# Patient Record
Sex: Female | Born: 1985 | Race: White | Hispanic: No | Marital: Married | Smoking: Never smoker
Health system: Southern US, Community
[De-identification: ages and names within clinical notes are randomized; demographics above are authoritative.]

## PROBLEM LIST (undated history)

## (undated) DIAGNOSIS — O99345 Other mental disorders complicating the puerperium: Secondary | ICD-10-CM

## (undated) DIAGNOSIS — Z1509 Genetic susceptibility to other malignant neoplasm: Secondary | ICD-10-CM

## (undated) DIAGNOSIS — K219 Gastro-esophageal reflux disease without esophagitis: Secondary | ICD-10-CM

## (undated) DIAGNOSIS — N879 Dysplasia of cervix uteri, unspecified: Secondary | ICD-10-CM

## (undated) DIAGNOSIS — R7303 Prediabetes: Secondary | ICD-10-CM

## (undated) DIAGNOSIS — F53 Postpartum depression: Secondary | ICD-10-CM

## (undated) HISTORY — DX: Dysplasia of cervix uteri, unspecified: N87.9

## (undated) HISTORY — PX: ESOPHAGOGASTRODUODENOSCOPY ENDOSCOPY: SHX5814

## (undated) HISTORY — PX: COLONOSCOPY: SHX174

---

## 2010-06-24 DIAGNOSIS — N879 Dysplasia of cervix uteri, unspecified: Secondary | ICD-10-CM

## 2010-06-24 HISTORY — DX: Dysplasia of cervix uteri, unspecified: N87.9

## 2014-06-24 NOTE — L&D Delivery Note (Signed)
Delivery Note At 8:04 PM a viable female with good cry was delivered via Vaginal, Spontaneous Delivery (Presentation: Right Occiput Anterior) with nuchal cord x 1 reduced over shoulder during delivery. Meconium stained amniotic fluid noted with delivery of fetal head. APGAR: 8, 9; weight pending .   Placenta status: Intact, Spontaneous.  Cord: 3 vessels with the following complications: None.    Anesthesia: Epidural  Episiotomy: None Lacerations: Partial 3rd degree Suture Repair: 2.0 3.0 chromic for repair of second degree and 2-0 Vicryl for repair of 3rd degree laceration. RV septum intact Est. Blood Loss (mL): 450  Mom to recovering in LDR.  Baby to Couplet care / Skin to Skin.  Jessica Cardenas 06/05/2015, 9:30 PM

## 2014-07-19 ENCOUNTER — Emergency Department: Payer: Self-pay | Admitting: Emergency Medicine

## 2014-07-19 LAB — CBC
HCT: 38 % (ref 35.0–47.0)
HGB: 12.8 g/dL (ref 12.0–16.0)
MCH: 28.3 pg (ref 26.0–34.0)
MCHC: 33.7 g/dL (ref 32.0–36.0)
MCV: 84 fL (ref 80–100)
PLATELETS: 252 10*3/uL (ref 150–440)
RBC: 4.53 10*6/uL (ref 3.80–5.20)
RDW: 13 % (ref 11.5–14.5)
WBC: 9.8 10*3/uL (ref 3.6–11.0)

## 2014-07-19 LAB — HCG, QUANTITATIVE, PREGNANCY: Beta Hcg, Quant.: 6 m[IU]/mL — ABNORMAL HIGH

## 2014-07-19 LAB — WET PREP, GENITAL

## 2014-07-20 LAB — GC/CHLAMYDIA PROBE AMP

## 2014-09-20 LAB — OB RESULTS CONSOLE ABO/RH: RH Type: POSITIVE

## 2014-09-20 LAB — OB RESULTS CONSOLE PLATELET COUNT: Platelets: 253 10*3/uL

## 2014-09-20 LAB — OB RESULTS CONSOLE HGB/HCT, BLOOD
HCT: 36 %
HEMOGLOBIN: 12 g/dL

## 2014-09-20 LAB — OB RESULTS CONSOLE VARICELLA ZOSTER ANTIBODY, IGG: Varicella: IMMUNE

## 2014-09-20 LAB — OB RESULTS CONSOLE GC/CHLAMYDIA
CHLAMYDIA, DNA PROBE: NEGATIVE
Gonorrhea: NEGATIVE

## 2014-09-20 LAB — OB RESULTS CONSOLE HIV ANTIBODY (ROUTINE TESTING): HIV: NONREACTIVE

## 2014-09-20 LAB — OB RESULTS CONSOLE HEPATITIS B SURFACE ANTIGEN: HEP B S AG: NEGATIVE

## 2014-09-20 LAB — OB RESULTS CONSOLE ANTIBODY SCREEN: ANTIBODY SCREEN: NEGATIVE

## 2014-09-20 LAB — OB RESULTS CONSOLE RPR: RPR: NONREACTIVE

## 2014-09-20 LAB — OB RESULTS CONSOLE RUBELLA ANTIBODY, IGM: Rubella: IMMUNE

## 2015-05-04 LAB — OB RESULTS CONSOLE GC/CHLAMYDIA
Chlamydia: NEGATIVE
Gonorrhea: NEGATIVE

## 2015-05-04 LAB — OB RESULTS CONSOLE GBS: GBS: NEGATIVE

## 2015-06-04 ENCOUNTER — Inpatient Hospital Stay
Admission: EM | Admit: 2015-06-04 | Discharge: 2015-06-06 | DRG: 775 | Disposition: A | Discharge status: Home / Self Care | Payer: BC Managed Care – PPO | Attending: Obstetrics and Gynecology | Admitting: Obstetrics and Gynecology

## 2015-06-04 ENCOUNTER — Encounter: Payer: Self-pay | Admitting: *Deleted

## 2015-06-04 DIAGNOSIS — Z3A40 40 weeks gestation of pregnancy: Secondary | ICD-10-CM

## 2015-06-04 DIAGNOSIS — O99214 Obesity complicating childbirth: Secondary | ICD-10-CM | POA: Diagnosis present

## 2015-06-04 DIAGNOSIS — Z809 Family history of malignant neoplasm, unspecified: Secondary | ICD-10-CM

## 2015-06-04 DIAGNOSIS — Z79899 Other long term (current) drug therapy: Secondary | ICD-10-CM

## 2015-06-04 DIAGNOSIS — O480 Post-term pregnancy: Secondary | ICD-10-CM | POA: Diagnosis present

## 2015-06-04 DIAGNOSIS — Z6841 Body Mass Index (BMI) 40.0 and over, adult: Secondary | ICD-10-CM

## 2015-06-04 DIAGNOSIS — Z1509 Genetic susceptibility to other malignant neoplasm: Secondary | ICD-10-CM

## 2015-06-04 HISTORY — DX: Genetic susceptibility to other malignant neoplasm: Z15.09

## 2015-06-04 MED ORDER — TERBUTALINE SULFATE 1 MG/ML IJ SOLN: 0.2500 mg | Freq: Once | INTRAMUSCULAR | Status: DC | PRN

## 2015-06-04 MED ORDER — SODIUM CHLORIDE 0.9 % IV SOLN: 250.0000 mL | INTRAVENOUS | Status: DC | PRN

## 2015-06-04 MED ORDER — OXYTOCIN BOLUS FROM INFUSION: 500.0000 mL | INTRAVENOUS | Status: DC

## 2015-06-04 MED ORDER — SODIUM CHLORIDE 0.9 % IJ SOLN: 3.0000 mL | INTRAMUSCULAR | Status: DC | PRN

## 2015-06-04 MED ORDER — SODIUM CHLORIDE 0.9 % IJ SOLN: 3.0000 mL | Freq: Two times a day (BID) | INTRAMUSCULAR | Status: DC

## 2015-06-04 MED ORDER — FAMOTIDINE 20 MG PO TABS: 20.0000 mg | ORAL_TABLET | Freq: Two times a day (BID) | ORAL | Status: DC | PRN

## 2015-06-04 MED ORDER — CITRIC ACID-SODIUM CITRATE 334-500 MG/5ML PO SOLN: 30.0000 mL | ORAL | Status: DC | PRN

## 2015-06-04 MED ORDER — OXYTOCIN 40 UNITS IN LACTATED RINGERS INFUSION - SIMPLE MED
62.5000 mL/h | INTRAVENOUS | Status: DC
  Filled 2015-06-04: qty 1000

## 2015-06-04 MED ORDER — LACTATED RINGERS IV SOLN
500.0000 mL | INTRAVENOUS | Status: DC | PRN
  Administered 2015-06-05 (×2): 500 mL via INTRAVENOUS

## 2015-06-04 MED ORDER — LACTATED RINGERS IV SOLN
INTRAVENOUS | Status: DC
  Administered 2015-06-04: 125 mL/h via INTRAVENOUS
  Administered 2015-06-05: 11:00:00 via INTRAVENOUS

## 2015-06-04 MED ORDER — ACETAMINOPHEN 325 MG PO TABS: 650.0000 mg | ORAL_TABLET | ORAL | Status: DC | PRN

## 2015-06-04 MED ORDER — ONDANSETRON HCL 4 MG/2ML IJ SOLN: 4.0000 mg | Freq: Four times a day (QID) | INTRAMUSCULAR | Status: DC | PRN

## 2015-06-04 MED ORDER — FENTANYL CITRATE (PF) 100 MCG/2ML IJ SOLN: 50.0000 ug | INTRAMUSCULAR | Status: DC | PRN

## 2015-06-04 MED ORDER — DINOPROSTONE 10 MG VA INST
10.0000 mg | VAGINAL_INSERT | Freq: Once | VAGINAL | Status: AC
Start: 2015-06-04 — End: 2015-06-04
  Administered 2015-06-04: 10 mg via VAGINAL
  Filled 2015-06-04: qty 1

## 2015-06-04 MED ORDER — LIDOCAINE HCL (PF) 1 % IJ SOLN
30.0000 mL | INTRAMUSCULAR | Status: DC | PRN
  Administered 2015-06-05: 5 mL via SUBCUTANEOUS
  Filled 2015-06-04: qty 30

## 2015-06-04 NOTE — H&P (Signed)
Obstetrics Admission History & Physical  06/04/2015 - 10:18 PM Primary OBGYN: [facility]  Chief Complaint: PDIOL  History of Present Illness  29 y.o. G2P0010 @ 40/6, with the above CC. Pregnancy complicated by: BMI 40, lynch carrier.  [name] states that she's had no s/s of labor or decreased FM  Review of Systems:  her 12 point review of systems is negative or as noted in the History of Present Illness.   PMHx:  Past Medical History  Diagnosis Date  . Vaginal Pap smear, abnormal     approx 5 years ago due to HPV, resolved  . Lynch syndrome    PSHx:  Past Surgical History  Procedure Laterality Date  . Colonoscopy N/A 01/2014    HX of Lynch Syndrome (genetic)   Medications:  Prescriptions prior to admission  Medication Sig Dispense Refill Last Dose  . Prenatal Vit-Fe Fumarate-FA (PRENATAL MULTIVITAMIN) TABS tablet Take 1 tablet by mouth daily at 12 noon.   06/03/2015 at 2030  . ranitidine (ZANTAC) 150 MG tablet Take 150 mg by mouth once.   06/03/2015 at 2030     Allergies: is allergic to aloe vera and penicillins. OBHx: SAB x 1 GYNHx:  History of abnormal pap smears: Yes.   History of STIs: No..             FHx:  Family History  Problem Relation Age of Onset  . Cancer - Other Maternal Grandmother   . Cardiomyopathy Maternal Grandmother   . Cancer - Other Paternal Grandmother   . Cancer - Other Paternal Grandfather    Soc Hx:  Social History   Social History  . Marital Status: Married    Spouse Name: N/A  . Number of Children: N/A  . Years of Education: N/A   Occupational History  . Not on file.   Social History Main Topics  . Smoking status: Never Smoker   . Smokeless tobacco: Not on file  . Alcohol Use: No  . Drug Use: No  . Sexual Activity: Yes   Other Topics Concern  . Not on file   Social History Narrative  . No narrative on file    Objective    Current Vital Signs 24h Vital Sign Ranges  T 98.1 F (36.7 C) Temp  Avg: 98.1 F  (36.7 C)  Min: 98.1 F (36.7 C)  Max: 98.1 F (36.7 C)  BP 122/81 mmHg BP  Min: 122/81  Max: 122/81  HR 88 Pulse  Avg: 88  Min: 88  Max: 88  RR 18 Resp  Avg: 18  Min: 18  Max: 18  SaO2    98/RA No Data Recorded       24 Hour I/O Current Shift I/O  Time Ins Outs       EFM: 125 baseline, +accels, no decels, mod var  Toco: quiet  General: Well nourished, well developed female in no acute distress.  Skin:  Warm and dry.  Cardiovascular: Regular rate and rhythm. Respiratory:  Clear to auscultation bilateral. Normal respiratory effort Abdomen: obese, nttp Neuro/Psych:  Normal mood and affect.   SSE: deferred SVE: FT per RN; was 0/70/-3 on 12/6 per Dr Jean RosenthalJackson Leopolds/EFW: 3700gm  Labs  pending  Radiology BSUS cephalic  Perinatal info  A pos/ Rubella  Immune / Varicella Immune/RPR pending/HIV Negative/HepB Surf Ag Negative/TDaP:yes / Flu shot: yes/pap neg 2015/  Assessment & Plan   29 y.o. G2P0010 @ 40/6 here for PDIOL; pt currently stable *IUP: category I with accels *  PDIOL: process d/w pt and husband. Recommend ripening with cervidil, which they are amenable to. Birth plan reviewed *BMI 40: keep close eye on labor curve, given untested pelvis. +40-45lbs weight gain this pregnancy. S/p normal 28wk 1hr GCT *GBS: neg *Analgesia: no current needs  Cornelia Copa. MD [facility] Pager [phone]

## 2015-06-05 ENCOUNTER — Encounter: Payer: Self-pay | Admitting: *Deleted

## 2015-06-05 ENCOUNTER — Inpatient Hospital Stay: Payer: BC Managed Care – PPO | Admitting: Anesthesiology

## 2015-06-05 LAB — CBC
HCT: 35.7 % (ref 35.0–47.0)
Hemoglobin: 12 g/dL (ref 12.0–16.0)
MCH: 28.7 pg (ref 26.0–34.0)
MCHC: 33.7 g/dL (ref 32.0–36.0)
MCV: 85.2 fL (ref 80.0–100.0)
PLATELETS: 220 10*3/uL (ref 150–440)
RBC: 4.19 MIL/uL (ref 3.80–5.20)
RDW: 13.4 % (ref 11.5–14.5)
WBC: 14.9 10*3/uL — AB (ref 3.6–11.0)

## 2015-06-05 LAB — TYPE AND SCREEN
ABO/RH(D): A POS
Antibody Screen: NEGATIVE

## 2015-06-05 LAB — ABO/RH: ABO/RH(D): A POS

## 2015-06-05 MED ORDER — MAGNESIUM HYDROXIDE 400 MG/5ML PO SUSP: 30.0000 mL | Freq: Every day | ORAL | Status: DC

## 2015-06-05 MED ORDER — IBUPROFEN 600 MG PO TABS
600.0000 mg | ORAL_TABLET | Freq: Four times a day (QID) | ORAL | Status: DC
  Administered 2015-06-05 – 2015-06-06 (×3): 600 mg via ORAL
  Filled 2015-06-05 (×3): qty 1

## 2015-06-05 MED ORDER — SIMETHICONE 80 MG PO CHEW: 80.0000 mg | CHEWABLE_TABLET | ORAL | Status: DC | PRN

## 2015-06-05 MED ORDER — WITCH HAZEL-GLYCERIN EX PADS: 1.0000 "application " | MEDICATED_PAD | CUTANEOUS | Status: DC | PRN

## 2015-06-05 MED ORDER — BUPIVACAINE HCL (PF) 0.25 % IJ SOLN
INTRAMUSCULAR | Status: DC | PRN
  Administered 2015-06-05: 5 mL via EPIDURAL
  Administered 2015-06-05: 3 mL via EPIDURAL

## 2015-06-05 MED ORDER — FENTANYL 2.5 MCG/ML W/ROPIVACAINE 0.2% IN NS 100 ML EPIDURAL INFUSION (ARMC-ANES)
EPIDURAL | Status: DC | PRN
  Administered 2015-06-05: 9 mL/h via EPIDURAL

## 2015-06-05 MED ORDER — DOCUSATE SODIUM 100 MG PO CAPS
100.0000 mg | ORAL_CAPSULE | Freq: Every day | ORAL | Status: DC
  Administered 2015-06-06: 100 mg via ORAL
  Filled 2015-06-05: qty 1

## 2015-06-05 MED ORDER — HYDROCODONE-ACETAMINOPHEN 5-325 MG PO TABS
1.0000 | ORAL_TABLET | ORAL | Status: DC | PRN
  Administered 2015-06-05 – 2015-06-06 (×5): 2 via ORAL
  Filled 2015-06-05 (×4): qty 2

## 2015-06-05 MED ORDER — FENTANYL 2.5 MCG/ML W/ROPIVACAINE 0.2% IN NS 100 ML EPIDURAL INFUSION (ARMC-ANES)
EPIDURAL | Status: AC
  Filled 2015-06-05: qty 100

## 2015-06-05 MED ORDER — LIDOCAINE HCL (PF) 1 % IJ SOLN
INTRAMUSCULAR | Status: DC | PRN
  Administered 2015-06-05: 2 mL via SUBCUTANEOUS

## 2015-06-05 MED ORDER — DIBUCAINE 1 % RE OINT: 1.0000 "application " | TOPICAL_OINTMENT | RECTAL | Status: DC | PRN

## 2015-06-05 MED ORDER — BUTORPHANOL TARTRATE 1 MG/ML IJ SOLN
INTRAMUSCULAR | Status: AC
  Administered 2015-06-05: 1 mg via INTRAVENOUS
  Filled 2015-06-05: qty 1

## 2015-06-05 MED ORDER — ONDANSETRON HCL 4 MG PO TABS
4.0000 mg | ORAL_TABLET | ORAL | Status: DC | PRN
Start: 2015-06-05 — End: 2015-06-07

## 2015-06-05 MED ORDER — HYDROCODONE-ACETAMINOPHEN 5-325 MG PO TABS
ORAL_TABLET | ORAL | Status: AC
Start: 2015-06-05 — End: 2015-06-06
  Filled 2015-06-05: qty 2

## 2015-06-05 MED ORDER — BENZOCAINE-MENTHOL 20-0.5 % EX AERO: 1.0000 "application " | INHALATION_SPRAY | CUTANEOUS | Status: DC | PRN

## 2015-06-05 MED ORDER — OXYTOCIN 40 UNITS IN LACTATED RINGERS INFUSION - SIMPLE MED: 62.5000 mL/h | INTRAVENOUS | Status: DC | PRN

## 2015-06-05 MED ORDER — LIDOCAINE-EPINEPHRINE (PF) 1.5 %-1:200000 IJ SOLN
INTRAMUSCULAR | Status: DC | PRN
  Administered 2015-06-05: 3 mL via EPIDURAL

## 2015-06-05 MED ORDER — OXYTOCIN 40 UNITS IN LACTATED RINGERS INFUSION - SIMPLE MED
1.0000 m[IU]/min | INTRAVENOUS | Status: DC
  Administered 2015-06-05: 1 m[IU]/min via INTRAVENOUS

## 2015-06-05 MED ORDER — ONDANSETRON HCL 4 MG/2ML IJ SOLN: 4.0000 mg | INTRAMUSCULAR | Status: DC | PRN

## 2015-06-05 MED ORDER — BUTORPHANOL TARTRATE 1 MG/ML IJ SOLN: 2.0000 mg | INTRAMUSCULAR | Status: DC | PRN

## 2015-06-05 MED ORDER — TERBUTALINE SULFATE 1 MG/ML IJ SOLN: 0.2500 mg | Freq: Once | INTRAMUSCULAR | Status: DC | PRN

## 2015-06-05 MED ORDER — FERROUS SULFATE 325 (65 FE) MG PO TABS
325.0000 mg | ORAL_TABLET | Freq: Every day | ORAL | Status: DC
  Administered 2015-06-06: 325 mg via ORAL
  Filled 2015-06-05: qty 1

## 2015-06-05 MED ORDER — BUTORPHANOL TARTRATE 1 MG/ML IJ SOLN
1.0000 mg | INTRAMUSCULAR | Status: DC | PRN
  Administered 2015-06-05: 1 mg via INTRAVENOUS

## 2015-06-05 MED ORDER — PRENATAL MULTIVITAMIN CH
1.0000 | ORAL_TABLET | Freq: Every day | ORAL | Status: DC
  Filled 2015-06-05: qty 1

## 2015-06-05 MED ORDER — LANOLIN HYDROUS EX OINT: TOPICAL_OINTMENT | CUTANEOUS | Status: DC | PRN

## 2015-06-05 NOTE — Progress Notes (Signed)
Cervidil out with v/e

## 2015-06-05 NOTE — Anesthesia Preprocedure Evaluation (Signed)
Anesthesia Evaluation  Patient identified by MRN, date of birth, ID band Patient awake    Reviewed: Allergy & Precautions, H&P , NPO status , Patient's Chart, lab work & pertinent test results  History of Anesthesia Complications Negative for: history of anesthetic complications  Airway Mallampati: II   Neck ROM: full    Dental no notable dental hx.    Pulmonary neg pulmonary ROS,           Cardiovascular negative cardio ROS Normal cardiovascular exam     Neuro/Psych    GI/Hepatic   Endo/Other    Renal/GU      Musculoskeletal   Abdominal   Peds  Hematology negative hematology ROS (+)   Anesthesia Other Findings   Reproductive/Obstetrics (+) Pregnancy                             Anesthesia Physical Anesthesia Plan  ASA: II  Anesthesia Plan: Epidural   Post-op Pain Management:    Induction:   Airway Management Planned:   Additional Equipment:   Intra-op Plan:   Post-operative Plan:   Informed Consent: I have reviewed the patients History and Physical, chart, labs and discussed the procedure including the risks, benefits and alternatives for the proposed anesthesia with the patient or authorized representative who has indicated his/her understanding and acceptance.     Plan Discussed with: Anesthesiologist  Anesthesia Plan Comments:         Anesthesia Quick Evaluation

## 2015-06-05 NOTE — Progress Notes (Signed)
Pt had decel with pushing with FHR down to 80's . Turned pt to right side. o2 on at 10l via mask. Colleen gutierrez,cnm in room for delivery

## 2015-06-05 NOTE — Anesthesia Procedure Notes (Signed)
Epidural  Start time: 06/05/2015 10:09 AM End time: 06/05/2015 10:23 AM  Staffing Anesthesiologist: Yevette EdwardsADAMS, JAMES G Resident/CRNA: Emry Tobin Performed by: resident/CRNA   Preanesthetic Checklist Completed: patient identified, site marked, surgical consent, pre-op evaluation, IV checked, risks and benefits discussed and monitors and equipment checked  Epidural Patient position: sitting Prep: Betadine Patient monitoring: heart rate, continuous pulse ox and blood pressure Approach: midline Location: L3-L4 Injection technique: LOR air  Needle:  Needle type: Tuohy  Needle gauge: 18 G Needle length: 9 cm Needle insertion depth: 6 cm Catheter type: closed end Catheter size: 20 Guage Catheter at skin depth: 11 cm Test dose: negative and 1.5% lidocaine with Epi 1:200 K  Assessment Events: blood not aspirated, injection not painful, no injection resistance, negative IV test and no paresthesia  Additional Notes Reason for block:procedure for pain

## 2015-06-05 NOTE — Progress Notes (Signed)
L&D progress Note  S: Comfortable with epidural  O: 121/61 General: appears comfortable in NAD FHR: 135 with accelerations to 160, moderate variability. Was having early/variable decelerations when on left side, resolved with position change IUPC: contractions q3 min, but mvus<100 with Pitocin at 4 miu/min Cervix; 4/80%/-1  A: some progress FWB: Cat 1   P: Continue to titrate pitocin to get adequate pattern Monitor fetal well being  Brandan Glauber, CNM

## 2015-06-05 NOTE — Progress Notes (Signed)
L&D Progress Note  S: Contractions getting stronger, rates pain at 5/10  O: General: breathing thru contractions BP 129/74 mmHg  Pulse 86  Temp(Src) 98.1 F (36.7 C) (Oral)  Resp 16  Ht 5\' 3"  (1.6 m)  Wt 101.152 kg (223 lb)  BMI 39.51 kg/m2  LMP 08/22/2014   SROM at 0100 for clear fluid 1cm at 0544 Cervix: 1/80%/-1 FHR: 125 baseline with accelerations to 150s to 160s, mod variability Toco: contractions q 2-3 min apart  A: Early labor with SROM x 7 hours  P: Stadol for pain, epidural with some progress Plan to insert IUPC and augment with Pitocin  Myasia Sinatra, CNM

## 2015-06-05 NOTE — Progress Notes (Signed)
Turned pt to left side.FHR down to 90's x 4 minutes. Colleen gutierrez,cnm comes into room. Turned back to right side. Peanut ball placed between pt's legs. Explained plan of care to pt and pt's family

## 2015-06-06 LAB — CBC
HCT: 29.6 % — ABNORMAL LOW (ref 35.0–47.0)
Hemoglobin: 10 g/dL — ABNORMAL LOW (ref 12.0–16.0)
MCH: 28.8 pg (ref 26.0–34.0)
MCHC: 33.7 g/dL (ref 32.0–36.0)
MCV: 85.3 fL (ref 80.0–100.0)
PLATELETS: 177 10*3/uL (ref 150–440)
RBC: 3.47 MIL/uL — AB (ref 3.80–5.20)
RDW: 13.5 % (ref 11.5–14.5)
WBC: 21 10*3/uL — AB (ref 3.6–11.0)

## 2015-06-06 LAB — RPR: RPR Ser Ql: NONREACTIVE

## 2015-06-06 MED ORDER — MAGNESIUM HYDROXIDE 400 MG/5ML PO SUSP: 30.0000 mL | Freq: Every day | ORAL | Status: DC

## 2015-06-06 MED ORDER — DOCUSATE SODIUM 100 MG PO CAPS: 100.0000 mg | ORAL_CAPSULE | Freq: Every day | ORAL | Status: DC

## 2015-06-06 MED ORDER — HYDROCODONE-ACETAMINOPHEN 5-325 MG PO TABS: 1.0000 | ORAL_TABLET | ORAL | Status: DC | PRN

## 2015-06-06 MED ORDER — IBUPROFEN 600 MG PO TABS: 600.0000 mg | ORAL_TABLET | Freq: Four times a day (QID) | ORAL | Status: DC

## 2015-06-06 NOTE — Progress Notes (Signed)
Pt discharged at this time via wheelchair escorted by nurse tech; going home with husband and new baby

## 2015-06-06 NOTE — Anesthesia Postprocedure Evaluation (Signed)
Anesthesia Post Note  Patient: Jessica Cardenas  Procedure(s) Performed: * No procedures listed *  Patient location during evaluation: Mother Baby Anesthesia Type: Epidural Level of consciousness: awake and alert and oriented Pain management: pain level controlled Vital Signs Assessment: vitals unstable Respiratory status: spontaneous breathing Cardiovascular status: stable Postop Assessment: no headache and no backache Anesthetic complications: no    Last Vitals:  Filed Vitals:   06/06/15 0503 06/06/15 0810  BP: 119/67 106/63  Pulse: 82 80  Temp: 36.9 C 36.5 C  Resp: 18 18    Last Pain:  Filed Vitals:   06/06/15 0858  PainSc: 3                  Chiropodisttephanie Adel Cardenas

## 2015-06-06 NOTE — Discharge Summary (Signed)
Obstetric Discharge Summary  Date of admission: 06/04/15 Date of Discharge: 06/06/15 Primary OB: [facility] Gestational age at delivery: 2741 weeks Antepartum complications: Lynch carrier, BMI 40  Reason for Admission: induction of labor  Anesthesia: Epidural Intrapartum Procedures: spontaneous vaginal delivery  Date of Delivery: 06/05/15 Delivered by: C. Sharen HonesGutierrez CNM Intrapartum complications: Meconium Stained Fluid with delivery of baby Placenta: Spontaneous, intact Postpartum Procedures: none Complications-Operative and Postpartum: third degree perineal laceration HEMOGLOBIN  Date Value Ref Range Status  06/06/2015 10.0* 12.0 - 16.0 g/dL Final  13/08/657803/29/2016 46.912.0 g/dL Final   HGB  Date Value Ref Range Status  07/19/2014 12.8 12.0-16.0 g/dL Final   HCT  Date Value Ref Range Status  06/06/2015 29.6* 35.0 - 47.0 % Final  09/20/2014 36 % Final  07/19/2014 38.0 35.0-47.0 % Final    Physical Exam:  General: alert, cooperative and no distress Lochia: appropriate Uterine Fundus: firm DVT Evaluation: no c/c/e  Prenatal Panel: A+/RI/VI/RPR non Reactive/HIV neg/Hep B Surface Ag neg/Pap neg/Plans to Breastfeed TDAP: Received at outside facility Flu: Received at outside facility Contraception: MiniPill  Discharge Diagnoses: Term Pregnancy-delivered  Discharge Information: Date: 06/06/2015 Activity: pelvic rest Diet: routine Medications: norco, ibuprofen, magnesium, colace Condition: stable Instructions: refer to practice specific booklet and postpartum care intructions after vaginal delivery Discharge to: home   Newborn Data: Live born female vigorous at birth  Birth Weight:   APGAR: 8, 9  Home with mother.  Jessica Cardenas 06/06/2015, 5:18 PM   This patient and plan were discussed with Dr Elesa MassedWard 06/06/2015

## 2015-06-06 NOTE — Discharge Instructions (Signed)
Sitz bath:  Use 3-4 times per day; follow instructions on the sitz bath that you have at home Ice to bottom:  Starting Wednesday morning, alternate cold and warm to your bottom; ice pack with every other pad After 2-3 days, just use the sitz bath 3-4 times per day Use the dermaplast spray to your bottom every time you use the bathroom Use your peri bottle ("squirt bottle") every time you use the bathroom    Vaginal Delivery, Care After Refer to this sheet in the next few weeks. These discharge instructions provide you with information on caring for yourself after delivery. Your health care provider may also give you specific instructions. Your treatment has been planned according to the most current medical practices available, but problems sometimes occur. Call your health care provider if you have any problems or questions after you go home. HOME CARE INSTRUCTIONS  Take over-the-counter or prescription medicines only as directed by your health care provider or pharmacist.  Do not drink alcohol, especially if you are breastfeeding or taking medicine to relieve pain.  Do not chew or smoke tobacco.  Do not use illegal drugs.  Continue to use good perineal care. Good perineal care includes:  Wiping your perineum from front to back.  Keeping your perineum clean.  Do not use tampons or douche until your health care provider says it is okay.  Shower, wash your hair, and take tub baths as directed by your health care provider.  Wear a well-fitting bra that provides breast support.  Eat healthy foods.  Drink enough fluids to keep your urine clear or pale yellow.  Eat high-fiber foods such as whole grain cereals and breads, brown rice, beans, and fresh fruits and vegetables every day. These foods may help prevent or relieve constipation.  Follow your health care provider's recommendations regarding resumption of activities such as climbing stairs, driving, lifting, exercising, or  traveling.  Talk to your health care provider about resuming sexual activities. Resumption of sexual activities is dependent upon your risk of infection, your rate of healing, and your comfort and desire to resume sexual activity.  Try to have someone help you with your household activities and your newborn for at least a few days after you leave the hospital.  Rest as much as possible. Try to rest or take a nap when your newborn is sleeping.  Increase your activities gradually.  Keep all of your scheduled postpartum appointments. It is very important to keep your scheduled follow-up appointments. At these appointments, your health care provider will be checking to make sure that you are healing physically and emotionally. SEEK MEDICAL CARE IF:   You are passing large clots from your vagina. Save any clots to show your health care provider.  You have a foul smelling discharge from your vagina.  You have trouble urinating.  You are urinating frequently.  You have pain when you urinate.  You have a change in your bowel movements.  You have increasing redness, pain, or swelling near your vaginal incision (episiotomy) or vaginal tear.  You have pus draining from your episiotomy or vaginal tear.  Your episiotomy or vaginal tear is separating.  You have painful, hard, or reddened breasts.  You have a severe headache.  You have blurred vision or see spots.  You feel sad or depressed.  You have thoughts of hurting yourself or your newborn.  You have questions about your care, the care of your newborn, or medicines.  You are dizzy or light-headed.  You have a rash.  You have nausea or vomiting.  You were breastfeeding and have not had a menstrual period within 12 weeks after you stopped breastfeeding.  You are not breastfeeding and have not had a menstrual period by the 12th week after delivery.  You have a fever. SEEK IMMEDIATE MEDICAL CARE IF:   You have persistent  pain.  You have chest pain.  You have shortness of breath.  You faint.  You have leg pain.  You have stomach pain.  Your vaginal bleeding saturates two or more sanitary pads in 1 hour.   This information is not intended to replace advice given to you by your health care provider. Make sure you discuss any questions you have with your health care provider.   Document Released: 06/07/2000 Document Revised: 03/01/2015 Document Reviewed: 02/05/2012 Elsevier Interactive Patient Education [employer]2016 Elsevier Inc.

## 2015-06-25 HISTORY — PX: ENDOMETRIAL BIOPSY: SHX622

## 2017-04-30 ENCOUNTER — Ambulatory Visit: Payer: Self-pay | Admitting: Certified Nurse Midwife

## 2017-05-26 NOTE — Progress Notes (Signed)
Gynecology Annual Exam  PCP: Verita Lamb, [facility] Chief Complaint:  Chief Complaint  Patient presents with  . Gynecologic Exam    History of Present Illness: Kianni Lheureux is a married 31 y.o. G1P1001 WF who presents for her annual gyn exam. The patient states she stopped taking BCPS in April and has been trying to conceive since May 2018 (6 months). Is currently taking prenatal vitamins and CBD oil (to help with stress)  Her menses are regular, they occur every month, and they last 4 days. Her flow is moderate. She does not have intermenstrual bleeding. Her last menstrual period was 05/23/2017. She has mild cramping on her two heavier days of bleeding. Last pap smear: 07/13/2015, results were NIL/negative HRHPV. 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 She does not have dyspareunia.  Since her last visit at the time of her endometrial biopsy, she has lost 20#  Her past medical history is remarkable for having Lynch syndrome. Individuals with a MSH2 mutation have up to an 80% lifetime colon cancer risk (though this risk may be closer to 50% in women). In addition, there are increased risks for stomach (5%), small bowel (3% in women, 6% in men), urinary tract (12% in women, 28% in men), and brain (3%) cancers. Women with a MSH2 mutation have a 40-60% lifetime risk of uterine cancer and a 9-12% risk of ovarian cancer. She is aware that a complete hysterectomy will reduce but not eliminate the risk for uterine and ovarian cancer.  Recommended surveillance/ risk reduction stategies include: 1. Colonoscopy at age 29-25 years or 2-5 years before the earliest colon cancer if the diagnosis is under 25 years; repeat every 1-2 years 2. Consider upper endoscopy extending through duodenum or into jejunum (upper sections of the small bowel) at age 60-35 years; repeat every 2-3 years  3. Consider capsule endoscopy for small bowel cancers at age 49-35 years; repeat every 2-3 years 4. Consider  annual urinalysis with cytology 5. Annual physical examination to screen for central nervous system cancers 6. Prophylactic hysterectomy and bilateral salpingo-oophorectomy is a risk reducing option for women who have completed childbearing 7. Consider talking to a gynecological oncologist about endometrial (uterine) and ovarian cancer surveillance. While it may be appropriate for some patients, there is no clear evidence to support surveillance for endometrial or ovarian cancer in women with Lynch syndrome.    She normally has annual endometrial biopsies, pelvic ultrasounds, colonoscopies, urine cytologies, and CEA 125s. She last had these studies last year and they were normal. Her endometrial biopsy was positive for a polyp. She is "taking a break" while trying to conceive.   The patient does perform self breast exams. Her last mammogram was NA    There is a family history of breast cancer in her MGM. Genetic testing has not been done.   There is a family history of ovarian cancer in her maternal aunt Genetic testing has been done. Her maternal aunt and the patient are positive for M2H2 mutation (Lynch syndrome). Patient was tested only for this particular mutation. Her MGF, mother and one sister are also positive for M2H2  The patient denies smoking.  She drinks one alcohol drink/month.   She denies illegal drug use.  The patient reports exercising regularly. She is training for a half marathon  The patient denies current symptoms of depression.    Review of Systems: Review of Systems  Constitutional: Negative for chills, fever and weight loss.  HENT: Negative for  congestion, sinus pain and sore throat.   Eyes: Negative for blurred vision and pain.  Respiratory: Negative for hemoptysis, shortness of breath and wheezing.   Cardiovascular: Negative for chest pain, palpitations and leg swelling.  Gastrointestinal: Negative for abdominal pain, blood in stool, diarrhea, heartburn,  nausea and vomiting.  Genitourinary: Negative for dysuria, frequency, hematuria and urgency.  Musculoskeletal: Negative for back pain, joint pain and myalgias.  Skin: Negative for itching and rash.  Neurological: Negative for dizziness, tingling and headaches.  Endo/Heme/Allergies: Negative for environmental allergies and polydipsia. Does not bruise/bleed easily.       Negative for hirsutism   Psychiatric/Behavioral: Negative for depression. The patient is nervous/anxious. The patient does not have insomnia.     Past Medical History:  Past Medical History:  Diagnosis Date  . 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 Lynch syndrome    per [facility] chart positive for MSH2- needs annual pelvic u/s, CA 125, endometrial bx, urine cytology, colonoscopy and endoscopy    Past Surgical History:  Past Surgical History:  Procedure Laterality Date  . COLONOSCOPY N/A 01/2014, 2017   HX of Lynch Syndrome (genetic)  . ENDOMETRIAL BIOPSY  2017   normal  . ESOPHAGOGASTRODUODENOSCOPY ENDOSCOPY      Family History:  Family History  Problem Relation Age of Onset  . Cardiomyopathy Maternal Grandmother   . Breast cancer Maternal Grandmother 66  . Stomach cancer Paternal Grandmother 65  . Colon cancer Maternal Grandfather        Lynch syndrome  . Ovarian cancer Maternal Aunt 30       lynch positive  . Uterine cancer Maternal Aunt    MGF, mother, sister and maternal uncle have Lynch syndrome Social History:  Social History   Socioeconomic History  . Marital status: Married    Spouse name: Not on file  . Number of children: 1  . Years of education: Not on file  . Highest education level: Not on file  Social Needs  . Financial resource strain: Not on file  . Food insecurity - worry: Not on file  . Food insecurity - inability: Not on file  . Transportation needs - medical: Not on file  . Transportation needs - non-medical: Not on file  Occupational History  . Occupation: Teacher-special education    Tobacco Use  . Smoking status: Never Smoker  . Smokeless tobacco: Never Used  Substance and Sexual Activity  . Alcohol use: Yes    Comment: occasional  . Drug use: No  . Sexual activity: Yes    Birth control/protection: None    Comment: trying to conceive  Other Topics Concern  . Not on file  Social History Narrative  . Not on file    Allergies:  Allergies  Allergen Reactions  . Aloe Vera [Aloe] Rash  . Penicillins Rash    As infant    Medications:  Current Outpatient Medications on File Prior to Visit  Medication Sig Dispense Refill  . Prenatal Vit-Fe Fumarate-FA (PRENATAL MULTIVITAMIN) TABS tablet Take 1 tablet by mouth daily at 12 noon.     No current facility-administered medications on file prior to visit.    Physical Exam Vitals:BP 102/62   Pulse 70   Ht '5\' 4"'  (1.626 m)   Wt 178 lb (80.7 kg)   LMP 05/23/2017 (Exact Date)   BMI 30.55 kg/m   General: WF in NAD HEENT: normocephalic, anicteric Neck: no thyroid enlargement, no palpable nodules, no cervical lymphadenopathy  Pulmonary: No increased work  of breathing, CTAB Cardiovascular: RRR, without murmur  Breast: Breast symmetrical, no tenderness, no palpable nodules or masses, no skin or nipple retraction present, no nipple discharge.  No axillary, infraclavicular or supraclavicular lymphadenopathy. Abdomen: Soft, non-tender, non-distended.  Umbilicus without lesions.  No hepatomegaly or masses palpable. No evidence of hernia. Genitourinary:  External: Normal external female genitalia.  Normal urethral meatus, normal Bartholin's and Skene's glands.    Vagina: Normal vaginal mucosa, no evidence of prolapse, scant menstrual blood   Cervix: Grossly normal in appearance, no bleeding, non-tender  Uterus: Retroverted, normal size, shape, and consistency, mobile, and non-tender  Adnexa: No adnexal masses, non-tender  Rectal: deferred  Lymphatic: no evidence of inguinal lymphadenopathy Extremities: no edema,  erythema, or tenderness Neurologic: Grossly intact Psychiatric: mood appropriate, affect full     Assessment: 31 y.o. G1P1001 annual gyn exam Lynch syndrome Trying to conceive  Plan:    1) Breast cancer screening - recommend monthly self breast exam.   2) Cervical cancer screening - Pap was done. ASCCP guidelines and rational discussed.  Patient opts for yearly screening interval  3) Continue PNV while trying to conceive.  4) Routine healthcare maintenance including cholesterol and diabetes screening declined   5) RTO 1 year and prn pregnancy.  Dalia Heading, CNM

## 2017-05-27 ENCOUNTER — Encounter: Payer: Self-pay | Admitting: Certified Nurse Midwife

## 2017-05-27 ENCOUNTER — Ambulatory Visit (INDEPENDENT_AMBULATORY_CARE_PROVIDER_SITE_OTHER): Payer: BC Managed Care – PPO | Admitting: Certified Nurse Midwife

## 2017-05-27 VITALS — BP 102/62 | HR 70 | Ht 64.0 in | Wt 178.0 lb

## 2017-05-27 DIAGNOSIS — Z124 Encounter for screening for malignant neoplasm of cervix: Secondary | ICD-10-CM

## 2017-05-27 DIAGNOSIS — Z1509 Genetic susceptibility to other malignant neoplasm: Secondary | ICD-10-CM

## 2017-05-27 DIAGNOSIS — Z87410 Personal history of cervical dysplasia: Secondary | ICD-10-CM | POA: Diagnosis not present

## 2017-05-27 DIAGNOSIS — Z01419 Encounter for gynecological examination (general) (routine) without abnormal findings: Secondary | ICD-10-CM

## 2017-05-29 LAB — IGP,RFX APTIMA HPV ALL PTH: PAP Smear Comment: 0

## 2017-07-23 ENCOUNTER — Encounter: Payer: Self-pay | Admitting: Obstetrics & Gynecology

## 2017-07-23 ENCOUNTER — Ambulatory Visit (INDEPENDENT_AMBULATORY_CARE_PROVIDER_SITE_OTHER): Payer: BC Managed Care – PPO | Admitting: Obstetrics & Gynecology

## 2017-07-23 VITALS — BP 110/60 | Wt 180.0 lb

## 2017-07-23 DIAGNOSIS — Z3201 Encounter for pregnancy test, result positive: Secondary | ICD-10-CM | POA: Diagnosis not present

## 2017-07-23 DIAGNOSIS — Z3490 Encounter for supervision of normal pregnancy, unspecified, unspecified trimester: Secondary | ICD-10-CM | POA: Insufficient documentation

## 2017-07-23 LAB — POCT URINE PREGNANCY: Preg Test, Ur: POSITIVE — AB

## 2017-07-23 NOTE — Progress Notes (Signed)
07/23/2017   Chief Complaint: Missed period  Transfer of Care Patient: no  History of Present Illness: Jessica Cardenas is a 32 y.o. G3P1011 45w5dbased on Patient's last menstrual period was 06/20/2017. with an Estimated Date of Delivery: 03/27/18, with the above CC.   Her periods were: regular periods every 28 days She was using no method when she conceived.  She has Positive signs or symptoms of nausea/vomiting of pregnancy. She has Negative signs or symptoms of miscarriage or preterm labor She identifies Negative Zika risk factors for her and her partner On any different medications around the time she conceived/early pregnancy: No  History of varicella: Yes   ROS: A 12-point review of systems was performed and negative, except as stated in the above HPI.  OBGYN History: As per HPI. OB History  Gravida Para Term Preterm AB Living  '3 1 1   1 1  ' SAB TAB Ectopic Multiple Live Births  1     0 1    # Outcome Date GA Lbr Len/2nd Weight Sex Delivery Anes PTL Lv  3 Current           2 Term 06/05/15 456w0d0:23 / 01:26 7 lb 12 oz (3.515 kg) M Vag-Spont EPI  LIV  1 SAB               Any issues with any prior pregnancies: no Any prior children are healthy, doing well, without any problems or issues: yes History of pap smears: Yes. Last pap smear 2018 (Dec). Abnormal: no  History of STIs: No   Past Medical History: Past Medical History:  Diagnosis Date  . 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 Lynch syndrome    per [facility] chart positive for MSH2- needs annual pelvic u/s, CA 125, endometrial bx, urine cytology, colonoscopy and endoscopy  . Lynch syndrome     Past Surgical History: Past Surgical History:  Procedure Laterality Date  . COLONOSCOPY N/A 01/2014, 2017   HX of Lynch Syndrome (genetic)  . ENDOMETRIAL BIOPSY  2017   normal  . ESOPHAGOGASTRODUODENOSCOPY ENDOSCOPY      Family History:  Family History  Problem Relation Age of Onset  . Cardiomyopathy Maternal Grandmother   .  Breast cancer Maternal Grandmother 4071. Stomach cancer Paternal Grandmother [id number]. Colon cancer Maternal Grandfather        Lynch syndrome  . Ovarian cancer Maternal Aunt 30       lynch positive  . Uterine cancer Maternal Aunt    She denies any female cancers, bleeding or blood clotting disorders.  She denies any history of mental retardation, birth defects or genetic disorders in her or the FOB's history  Social History:  Social History   Socioeconomic History  . Marital status: Married    Spouse name: Not on file  . Number of children: 1  . Years of education: Not on file  . Highest education level: Not on file  Social Needs  . Financial resource strain: Not on file  . Food insecurity - worry: Not on file  . Food insecurity - inability: Not on file  . Transportation needs - medical: Not on file  . Transportation needs - non-medical: Not on file  Occupational History  . Occupation: Teacher-special education  Tobacco Use  . Smoking status: Never Smoker  . Smokeless tobacco: Never Used  Substance and Sexual Activity  . Alcohol use: Yes    Comment: occasional  . Drug use: No  .  Sexual activity: Yes    Birth control/protection: None    Comment: trying to conceive  Other Topics Concern  . Not on file  Social History Narrative  . Not on file   Any pets in the household: no  Allergy: Allergies  Allergen Reactions  . Aloe Vera [Aloe] Rash  . Penicillins Rash    As infant    Current Outpatient Medications:  Current Outpatient Medications:  .  Prenatal Vit-Fe Fumarate-FA (PRENATAL MULTIVITAMIN) TABS tablet, Take 1 tablet by mouth daily at 12 noon., Disp: , Rfl:    Physical Exam:   BP 110/60   Wt 180 lb (81.6 kg)   LMP 06/20/2017   BMI 30.90 kg/m  Body mass index is 30.9 kg/m. Constitutional: Well nourished, well developed female in no acute distress.  Neck:  Supple, normal appearance, and no thyromegaly  Cardiovascular: S1, S2 normal, no murmur, rub or  gallop, regular rate and rhythm Respiratory:  Clear to auscultation bilateral. Normal respiratory effort Abdomen: positive bowel sounds and no masses, hernias; diffusely non tender to palpation, non distended Breasts: breasts appear normal, no suspicious masses, no skin or nipple changes or axillary nodes. Neuro/Psych:  Normal mood and affect.  Skin:  Warm and dry.  Lymphatic:  No inguinal lymphadenopathy.   Pelvic exam: is not limited by body habitus EGBUS: within normal limits, Vagina: within normal limits and with no blood in the vault, Cervix: normal appearing cervix without discharge or lesions, closed/long/high, Uterus:  nonenlarged, and Adnexa:  normal adnexa and no mass, fullness, tenderness  Assessment: Jessica Cardenas is a 32 y.o. G3P1011 59w5dbased on Patient's last menstrual period was 06/20/2017. with an Estimated Date of Delivery: 03/27/18,  for prenatal care.  Plan:  1) Avoid alcoholic beverages. 2) Patient encouraged not to smoke.  3) Discontinue the use of all non-medicinal drugs and chemicals.  4) Take prenatal vitamins daily.  5) Seatbelt use advised 6) Nutrition, food safety (fish, cheese advisories, and high nitrite foods) and exercise discussed. 7) [facility]  8) Patient is asked about travel to areas at risk for the [place], and counseled to avoid travel and exposure to mosquitoes or sexual partners who may have themselves been exposed to the virus. Testing is discussed, and will be ordered as appropriate.  9) [facility] 10) Genetic Screening, such as with 1st Trimester Screening, cell free fetal DNA, AFP testing, and Ultrasound, as well as with amniocentesis and CVS as appropriate, is discussed with patient. She plans to have genetic testing this pregnancy. 11) [place] 2 weeks  Problem list reviewed and updated.  PBarnett Applebaum MD, [facility]ardon[facility]yn, [facility] 07/23/2017  2:55  PM

## 2017-07-23 NOTE — Patient Instructions (Signed)
First Trimester of Pregnancy The first trimester of pregnancy is from week 1 until the end of week 13 (months 1 through 3). A week after a sperm fertilizes an egg, the egg will implant on the wall of the uterus. This embryo will begin to develop into a baby. Genes from you and your partner will form the baby. The female genes will determine whether the baby will be a boy or a girl. At 6-8 weeks, the eyes and face will be formed, and the heartbeat can be seen on ultrasound. At the end of 12 weeks, all the baby's organs will be formed. Now that you are pregnant, you will want to do everything you can to have a healthy baby. Two of the most important things are to get good prenatal care and to follow your health care provider's instructions. Prenatal care is all the medical care you receive before the baby's birth. This care will help prevent, find, and treat any problems during the pregnancy and childbirth. Body changes during your first trimester Your body goes through many changes during pregnancy. The changes vary from woman to woman.  You may gain or lose a couple of pounds at first.  You may feel sick to your stomach (nauseous) and you may throw up (vomit). If the vomiting is uncontrollable, call your health care provider.  You may tire easily.  You may develop headaches that can be relieved by medicines. All medicines should be approved by your health care provider.  You may urinate more often. Painful urination may mean you have a bladder infection.  You may develop heartburn as a result of your pregnancy.  You may develop constipation because certain hormones are causing the muscles that push stool through your intestines to slow down.  You may develop hemorrhoids or swollen veins (varicose veins).  Your breasts may begin to grow larger and become tender. Your nipples may stick out more, and the tissue that surrounds them (areola) may become darker.  Your gums may bleed and may be  sensitive to brushing and flossing.  Dark spots or blotches (chloasma, mask of pregnancy) may develop on your face. This will likely fade after the baby is born.  Your menstrual periods will stop.  You may have a loss of appetite.  You may develop cravings for certain kinds of food.  You may have changes in your emotions from day to day, such as being excited to be pregnant or being concerned that something may go wrong with the pregnancy and baby.  You may have more vivid and strange dreams.  You may have changes in your hair. These can include thickening of your hair, rapid growth, and changes in texture. Some women also have hair loss during or after pregnancy, or hair that feels dry or thin. Your hair will most likely return to normal after your baby is born.  What to expect at prenatal visits During a routine prenatal visit:  You will be weighed to make sure you and the baby are growing normally.  Your blood pressure will be taken.  Your abdomen will be measured to track your baby's growth.  The fetal heartbeat will be listened to between weeks 10 and 14 of your pregnancy.  Test results from any previous visits will be discussed.  Your health care provider may ask you:  How you are feeling.  If you are feeling the baby move.  If you have had any abnormal symptoms, such as leaking fluid, bleeding, severe headaches,   or abdominal cramping.  If you are using any tobacco products, including cigarettes, chewing tobacco, and electronic cigarettes.  If you have any questions.  Other tests that may be performed during your first trimester include:  Blood tests to find your blood type and to check for the presence of any previous infections. The tests will also be used to check for low iron levels (anemia) and protein on red blood cells (Rh antibodies). Depending on your risk factors, or if you previously had diabetes during pregnancy, you may have tests to check for high blood  sugar that affects pregnant women (gestational diabetes).  Urine tests to check for infections, diabetes, or protein in the urine.  An ultrasound to confirm the proper growth and development of the baby.  Fetal screens for spinal cord problems (spina bifida) and Down syndrome.  HIV (human immunodeficiency virus) testing. Routine prenatal testing includes screening for HIV, unless you choose not to have this test.  You may need other tests to make sure you and the baby are doing well.  Follow these instructions at home: Medicines  Follow your health care provider's instructions regarding medicine use. Specific medicines may be either safe or unsafe to take during pregnancy.  Take a prenatal vitamin that contains at least 600 micrograms (mcg) of folic acid.  If you develop constipation, try taking a stool softener if your health care provider approves. Eating and drinking  Eat a balanced diet that includes fresh fruits and vegetables, whole grains, good sources of protein such as meat, eggs, or tofu, and low-fat dairy. Your health care provider will help you determine the amount of weight gain that is right for you.  Avoid raw meat and uncooked cheese. These carry germs that can cause birth defects in the baby.  Eating four or five small meals rather than three large meals a day may help relieve nausea and vomiting. If you start to feel nauseous, eating a few soda crackers can be helpful. Drinking liquids between meals, instead of during meals, also seems to help ease nausea and vomiting.  Limit foods that are high in fat and processed sugars, such as fried and sweet foods.  To prevent constipation: ? Eat foods that are high in fiber, such as fresh fruits and vegetables, whole grains, and beans. ? Drink enough fluid to keep your urine clear or pale yellow. Activity  Exercise only as directed by your health care provider. Most women can continue their usual exercise routine during  pregnancy. Try to exercise for 30 minutes at least 5 days a week. Exercising will help you: ? Control your weight. ? Stay in shape. ? Be prepared for labor and delivery.  Experiencing pain or cramping in the lower abdomen or lower back is a good sign that you should stop exercising. Check with your health care provider before continuing with normal exercises.  Try to avoid standing for long periods of time. Move your legs often if you must stand in one place for a long time.  Avoid heavy lifting.  Wear low-heeled shoes and practice good posture.  You may continue to have sex unless your health care provider tells you not to. Relieving pain and discomfort  Wear a good support bra to relieve breast tenderness.  Take warm sitz baths to soothe any pain or discomfort caused by hemorrhoids. Use hemorrhoid cream if your health care provider approves.  Rest with your legs elevated if you have leg cramps or low back pain.  If you develop   varicose veins in your legs, wear support hose. Elevate your feet for 15 minutes, 3-4 times a day. Limit salt in your diet. Prenatal care  Schedule your prenatal visits by the twelfth week of pregnancy. They are usually scheduled monthly at first, then more often in the last 2 months before delivery.  Write down your questions. Take them to your prenatal visits.  Keep all your prenatal visits as told by your health care provider. This is important. Safety  Wear your seat belt at all times when driving.  Make a list of emergency phone numbers, including numbers for family, friends, the hospital, and police and fire departments. General instructions  Ask your health care provider for a referral to a local prenatal education class. Begin classes no later than the beginning of month 6 of your pregnancy.  Ask for help if you have counseling or nutritional needs during pregnancy. Your health care provider can offer advice or refer you to specialists for help  with various needs.  Do not use hot tubs, steam rooms, or saunas.  Do not douche or use tampons or scented sanitary pads.  Do not cross your legs for long periods of time.  Avoid cat litter boxes and soil used by cats. These carry germs that can cause birth defects in the baby and possibly loss of the fetus by miscarriage or stillbirth.  Avoid all smoking, herbs, alcohol, and medicines not prescribed by your health care provider. Chemicals in these products affect the formation and growth of the baby.  Do not use any products that contain nicotine or tobacco, such as cigarettes and e-cigarettes. If you need help quitting, ask your health care provider. You may receive counseling support and other resources to help you quit.  Schedule a dentist appointment. At home, brush your teeth with a soft toothbrush and be gentle when you floss. Contact a health care provider if:  You have dizziness.  You have mild pelvic cramps, pelvic pressure, or nagging pain in the abdominal area.  You have persistent nausea, vomiting, or diarrhea.  You have a bad smelling vaginal discharge.  You have pain when you urinate.  You notice increased swelling in your face, hands, legs, or ankles.  You are exposed to fifth disease or chickenpox.  You are exposed to German measles (rubella) and have never had it. Get help right away if:  You have a fever.  You are leaking fluid from your vagina.  You have spotting or bleeding from your vagina.  You have severe abdominal cramping or pain.  You have rapid weight gain or loss.  You vomit blood or material that looks like coffee grounds.  You develop a severe headache.  You have shortness of breath.  You have any kind of trauma, such as from a fall or a car accident. Summary  The first trimester of pregnancy is from week 1 until the end of week 13 (months 1 through 3).  Your body goes through many changes during pregnancy. The changes vary from  woman to woman.  You will have routine prenatal visits. During those visits, your health care provider will examine you, discuss any test results you may have, and talk with you about how you are feeling. This information is not intended to replace advice given to you by your health care provider. Make sure you discuss any questions you have with your health care provider. Document Released: 06/04/2001 Document Revised: 05/22/2016 Document Reviewed: 05/22/2016 Elsevier Interactive Patient Education  2018 Elsevier   Inc.  

## 2017-07-24 LAB — RPR+RH+ABO+RUB AB+AB SCR+CB...
ANTIBODY SCREEN: NEGATIVE
HEMOGLOBIN: 12.2 g/dL (ref 11.1–15.9)
HIV Screen 4th Generation wRfx: NONREACTIVE
Hematocrit: 37.9 % (ref 34.0–46.6)
Hepatitis B Surface Ag: NEGATIVE
MCH: 27.1 pg (ref 26.6–33.0)
MCHC: 32.2 g/dL (ref 31.5–35.7)
MCV: 84 fL (ref 79–97)
Platelets: 275 10*3/uL (ref 150–379)
RBC: 4.51 x10E6/uL (ref 3.77–5.28)
RDW: 12.9 % (ref 12.3–15.4)
RPR Ser Ql: NONREACTIVE
Rh Factor: POSITIVE
Rubella Antibodies, IGG: 5.35 index (ref >0.99)
VARICELLA: 2515 {index} (ref >165)
WBC: 10 10*3/uL (ref 3.4–10.8)

## 2017-07-25 LAB — URINE CULTURE

## 2017-07-25 LAB — GC/CHLAMYDIA PROBE AMP
Chlamydia trachomatis, NAA: NEGATIVE
Neisseria gonorrhoeae by PCR: NEGATIVE

## 2017-08-03 ENCOUNTER — Encounter: Payer: Self-pay | Admitting: Obstetrics & Gynecology

## 2017-08-04 ENCOUNTER — Other Ambulatory Visit: Payer: Self-pay | Admitting: Obstetrics & Gynecology

## 2017-08-04 ENCOUNTER — Other Ambulatory Visit: Payer: Self-pay | Admitting: Obstetrics and Gynecology

## 2017-08-04 MED ORDER — DOXYLAMINE-PYRIDOXINE 10-10 MG PO TBEC: 2.0000 | DELAYED_RELEASE_TABLET | Freq: Every day | ORAL | 5 refills | Status: DC

## 2017-08-04 NOTE — Telephone Encounter (Signed)
I am off today, please give to someone active in office

## 2017-08-04 NOTE — Telephone Encounter (Signed)
I sent in diclegis. Is it going to need Pa? Pls let pt know. Thx.

## 2017-08-04 NOTE — Telephone Encounter (Signed)
[facility] sent it in today.

## 2017-08-04 NOTE — Telephone Encounter (Signed)
Pt is calling needing a prescription for diclegis for nausea. Please advise

## 2017-08-04 NOTE — Progress Notes (Signed)
Rx diclegis for NVP 

## 2017-08-04 NOTE — Telephone Encounter (Signed)
[facility] ?

## 2017-08-04 NOTE — Telephone Encounter (Signed)
Please advise 

## 2017-08-08 ENCOUNTER — Ambulatory Visit (INDEPENDENT_AMBULATORY_CARE_PROVIDER_SITE_OTHER): Payer: BC Managed Care – PPO

## 2017-08-08 ENCOUNTER — Ambulatory Visit (INDEPENDENT_AMBULATORY_CARE_PROVIDER_SITE_OTHER): Payer: BC Managed Care – PPO | Admitting: Obstetrics & Gynecology

## 2017-08-08 VITALS — BP 120/80 | Wt 178.0 lb

## 2017-08-08 DIAGNOSIS — Z3201 Encounter for pregnancy test, result positive: Secondary | ICD-10-CM | POA: Diagnosis not present

## 2017-08-08 DIAGNOSIS — Z3490 Encounter for supervision of normal pregnancy, unspecified, unspecified trimester: Secondary | ICD-10-CM

## 2017-08-08 DIAGNOSIS — Z3A01 Less than 8 weeks gestation of pregnancy: Secondary | ICD-10-CM

## 2017-08-08 MED ORDER — VITAMIN B-6 25 MG PO TABS: 25.0000 mg | ORAL_TABLET | Freq: Three times a day (TID) | ORAL | 3 refills | Status: DC

## 2017-08-08 MED ORDER — METOCLOPRAMIDE HCL 10 MG PO TABS: 10.0000 mg | ORAL_TABLET | Freq: Three times a day (TID) | ORAL | 3 refills | Status: DC

## 2017-08-08 NOTE — Patient Instructions (Signed)

## 2017-08-08 NOTE — Progress Notes (Signed)
  HPI: Early OB Ultrasound demonstrates gest sac, yolc sac, FHT 129 These findings are EDC discrepant by 6 days  PMHx: She  has a past medical history of Cervical dysplasia (2012), Lynch syndrome, and Lynch syndrome. Also,  has a past surgical history that includes Colonoscopy (N/A, 01/2014, 2017); Endometrial biopsy (2017); and Esophagogastroduodenoscopy endoscopy., family history includes Breast cancer (age of onset: 6540) in her maternal grandmother; Cardiomyopathy in her maternal grandmother; Colon cancer in her maternal grandfather; Ovarian cancer (age of onset: 5230) in her maternal aunt; Stomach cancer (age of onset: 8075) in her paternal grandmother; Uterine cancer in her maternal aunt.,  reports that  has never smoked. she has never used smokeless tobacco. She reports that she drinks alcohol. She reports that she does not use drugs.  She has a current medication list which includes the following prescription(s): doxylamine-pyridoxine and prenatal multivitamin. Also, is allergic to aloe vera [aloe] and penicillins.  ROS  Objective: BP 120/80   Wt 178 lb (80.7 kg)   LMP 06/20/2017   BMI 30.55 kg/m   Physical examination Constitutional NAD, Conversant  Skin No rashes, lesions or ulceration.   Extremities: Moves all appropriately.  Normal ROM for age. No lymphadenopathy.  Neuro: Grossly intact  Psych: Oriented to PPT.  Normal mood. Normal affect.   No results found.  Assessment:  [redacted] weeks gestation of pregnancy  Encounter for supervision of low-risk pregnancy, antepartum Change EDC Monitor nausea.   Meds  Annamarie MajorPaul Harris, MD, Merlinda FrederickFACOG [facility], [facility] 08/08/2017  5:03 PM

## 2017-08-21 ENCOUNTER — Telehealth: Payer: Self-pay

## 2017-08-21 NOTE — Telephone Encounter (Signed)
Pt c/o dark brown spotting today. Had some brown discharge the other day. Denies abnormal cramping. Advised to monitor and if bleeding turns bright red or like a period to contact office or go to ER. Also notify if cramping increases or having unusual pain. Pt has ROB scheduled for 08/29/17.

## 2017-08-25 ENCOUNTER — Ambulatory Visit (INDEPENDENT_AMBULATORY_CARE_PROVIDER_SITE_OTHER): Payer: BC Managed Care – PPO | Admitting: Obstetrics and Gynecology

## 2017-08-25 VITALS — BP 120/70 | Wt 188.0 lb

## 2017-08-25 DIAGNOSIS — O021 Missed abortion: Secondary | ICD-10-CM | POA: Diagnosis not present

## 2017-08-25 NOTE — Progress Notes (Signed)
ROB Spotting 

## 2017-08-25 NOTE — Progress Notes (Signed)
Obstetric Problem Visit    Chief Complaint:  Chief Complaint  Patient presents with  . ROB    History of Present Illness: Patient is a 32 y.o. G3P1011 49w5dpresenting for first trimester bleeding.  The onset of bleeding was 4 days ago.  Is bleeding equal to or greater than normal menstrual flow:  No Any recent trauma:  No Recent intercourse:  Yes History of prior miscarriage:  Yes Prior ultrasound demonstrating IUP: office visit on 08/08/2017.  Prior ultrasound demonstrating viable IUP:  Yes Prior Serum HCG:  No Rh status: positive  Bleeding has been light, minimal cramping.  Review of Systems: Review of Systems  Constitutional: Negative for chills and fever.  Gastrointestinal: Negative for abdominal pain.    Past Medical History:  Past Medical History:  Diagnosis Date  . 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 Lynch syndrome    per [facility] chart positive for MSH2- needs annual pelvic u/s, CA 125, endometrial bx, urine cytology, colonoscopy and endoscopy  . Lynch syndrome     Past Surgical History:  Past Surgical History:  Procedure Laterality Date  . COLONOSCOPY N/A 01/2014, 2017   HX of Lynch Syndrome (genetic)  . ENDOMETRIAL BIOPSY  2017   normal  . ESOPHAGOGASTRODUODENOSCOPY ENDOSCOPY      Obstetric History: [id number] Family History:  Family History  Problem Relation Age of Onset  . Cardiomyopathy Maternal Grandmother   . Breast cancer Maternal Grandmother 422 . Stomach cancer Paternal Grandmother 79 . Colon cancer Maternal Grandfather        Lynch syndrome  . Ovarian cancer Maternal Aunt 30       lynch positive  . Uterine cancer Maternal Aunt     Social History:  Social History   Socioeconomic History  . Marital status: Married    Spouse name: Not on file  . Number of children: 1  . Years of education: Not on file  . Highest education level: Not on file  Social Needs  . Financial resource strain: Not on file  . Food insecurity - worry: Not on file   . Food insecurity - inability: Not on file  . Transportation needs - medical: Not on file  . Transportation needs - non-medical: Not on file  Occupational History  . Occupation: Teacher-special education  Tobacco Use  . Smoking status: Never Smoker  . Smokeless tobacco: Never Used  Substance and Sexual Activity  . Alcohol use: Yes    Comment: occasional  . Drug use: No  . Sexual activity: Yes    Birth control/protection: None    Comment: trying to conceive  Other Topics Concern  . Not on file  Social History Narrative  . Not on file    Allergies:  Allergies  Allergen Reactions  . Aloe Vera [Aloe] Rash  . Penicillins Rash    As infant    Medications: Prior to Admission medications   Medication Sig Start Date End Date Taking? Authorizing Provider  Doxylamine-Pyridoxine (DICLEGIS) 10-10 MG TBEC Take 2 tablets by mouth at bedtime. If symptoms persist, add one tablet in the morning and one in the afternoon 26/83/41  Copland, Alicia B, PA-C  ibuprofen (ADVIL,MOTRIN) 600 MG tablet Take 1 tablet (600 mg total) by mouth every 6 (six) hours as needed. 08/26/17   SMalachy Mood MD  metoCLOPramide (REGLAN) 10 MG tablet Take 1 tablet (10 mg total) by mouth 3 (three) times daily before meals. 08/08/17   HGae Dry MD  misoprostol (CYTOTEC) 200 MCG tablet Place 4 tablets in between your gums and cheeks, repeat every 6-hrs as needed 08/26/17   Malachy Mood, MD  oxyCODONE-acetaminophen (PERCOCET/ROXICET) 5-325 MG tablet Take 1-2 tablets by mouth every 6 (six) hours as needed. 08/26/17   Malachy Mood, MD  Prenatal Vit-Fe Fumarate-FA (PRENATAL MULTIVITAMIN) TABS tablet Take 1 tablet by mouth daily at 12 noon.    [provider]  vitamin B-6 (PYRIDOXINE) 25 MG tablet Take 1 tablet (25 mg total) by mouth 3 (three) times daily. 08/08/17   Gae Dry, MD    Physical Exam Vitals: Blood pressure 120/70, weight 188 lb (85.3 kg), last menstrual period 06/20/2017, unknown  if currently breastfeeding. General: NAD HEENT: normocephalic, anicteric Pulmonary: No increased work of breathing, Extremities: no edema, erythema, or tenderness Neurologic: Grossly intact Psychiatric: mood appropriate, affect   Transabdominal ultrasound unable to demonstrate FHT, transvaginal ultrasound performed revealing singleton IUP, CRL of 44w0dwith no visible cardiac activity.  "Society of Radiologyst in Ultrasound Guidelines for Transvaginal Ultrasonographic Diagnosis of Early Pregnancy Loss" and adopted in AHockingNumber 150, May 2015 (reaffirmed 2017) "Early Pregnancy Loss"  - CRL of 765mor greater and absence of fetal heartbeat  - Mean sac diameter of 2556mr greater and no embryo  - Absence of embryo with a discernable heartbeat 2 week after initial ultrasound showing a gestational sac without a yolk sac  - Absence of embryo with a discernable heartbeat 11 days after initial ultrasound showing a gestational sac with  yolk sac present  Assessment: 31 83o. G3P1011 8w540w5dsenting for evaluation of first trimester vaginal bleeding  Plan: Problem List Items Addressed This Visit    None    Visit Diagnoses    Missed abortion    -  Primary      1)  Condolences were offered to the patient and her family.  I stressed that while emotionally difficult, that this did not occur because of an actions or inactions by the patient.  Somewhere between 10-20% of identified first trimester pregnancies will unfortunately end in miscarriage.  Given this relatively high incidence rate, further diagnostic testing such as chromosome analysis is generally not clinically relevant nor recommended.  Although the chromosomal abnormalities have been implicated at rates as high as 70% in some studies, these are generally random and do not infer and increased risk of recurrence with subsequent pregnancies.  However, 3 or more consecutive first trimester losses are relatively uncommon, and  these patient generally do benefit from additional work up to determine a potential modifiable etiology.   We briefly discussed management options including expectant management, medical management, and surgical management as well as their relative success rates and complications. Approximately 80% of first trimester miscarriages will pass successfully but may require a time frame of up to 8 weeks (ACOGSanta [place] May 2015 "Early Pregnancy Loss").  Medical management using 800mc79m misoprostil administered every 3-hrs as needed for up to 3 doses speeds up the time frame to completion significantly, has literature supporting its use up to 63 days or [redacted]w[redacted]d 55w0dtion and results in a passage rate of 84-85% (ACOG Practice Bulletin 143 March 2014 "Medical Management of First-Trimester Abortion").  Dilation and curettage has the highest rate of uterine evacuation, but carries with is operative cost, surgical and anesthetic risk.  While these risk are relatively small they nevertheless include infection, bleeding, uterine perforation, formation of uterine synechia, and in rare cases death.   We discussed repeat ultrasound  and or trending HCG levels if the patient wishes to pursue these prior to making her decision.  Clinically I am confident of the diagnosis, but I do not want any doubts in the patient's mind regarding the plan of management she chooses to adopt.  I will allow the patient and her family to discuss management options and she was advised to contact the office to arrange final disposition one she has made her decision or should she have any follow up questions for myself.    2) The patient is Rh A positive, rhogam is therefore not indicated to decrease the risk rhesus alloimmunization.    3) Routine bleeding precautions were discussed with the patient prior the conclusion of today's visit.  4) Return in about 1 day (around 08/26/2017) for ok to [facility] tomorrow.   Malachy Mood, MD,  [facility], [facility] 08/26/2017, 10:09 PM    Missed abortion with CRL of 11w0don bedside ultrasound

## 2017-08-26 ENCOUNTER — Ambulatory Visit (INDEPENDENT_AMBULATORY_CARE_PROVIDER_SITE_OTHER): Payer: BC Managed Care – PPO | Admitting: Obstetrics and Gynecology

## 2017-08-26 DIAGNOSIS — O021 Missed abortion: Secondary | ICD-10-CM

## 2017-08-26 MED ORDER — MISOPROSTOL 200 MCG PO TABS: ORAL_TABLET | ORAL | 0 refills | Status: DC

## 2017-08-26 MED ORDER — OXYCODONE-ACETAMINOPHEN 5-325 MG PO TABS: 1.0000 | ORAL_TABLET | Freq: Four times a day (QID) | ORAL | 0 refills | Status: DC | PRN

## 2017-08-26 MED ORDER — IBUPROFEN 600 MG PO TABS: 600.0000 mg | ORAL_TABLET | Freq: Four times a day (QID) | ORAL | 3 refills | Status: DC | PRN

## 2017-08-26 NOTE — Progress Notes (Signed)
Obstetrics & Gynecology Office Visit   Chief Complaint: No chief complaint on file.   History of Present Illness: 32 year old G3P1011 presenting for follow up of 64w0dmissed [place].  She was counseled on the management options at yesterdays visit and returns today to further discuss.  I offered her one last ultrasound should she desire one which she did not.  After contemplating the options she and her husband opt for medical management.    Since yesterday she has had a slight increase in bleeding and cramping.  Bleeding is still relatively minimal.     Review of Systems: Review of Systems  Constitutional: Negative.   Gastrointestinal: Negative.      Past Medical History:  Past Medical History:  Diagnosis Date  . 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 Lynch syndrome    per [facility] chart positive for MSH2- needs annual pelvic u/s, CA 125, endometrial bx, urine cytology, colonoscopy and endoscopy  . Lynch syndrome     Past Surgical History:  Past Surgical History:  Procedure Laterality Date  . COLONOSCOPY N/A 01/2014, 2017   HX of Lynch Syndrome (genetic)  . ENDOMETRIAL BIOPSY  2017   normal  . ESOPHAGOGASTRODUODENOSCOPY ENDOSCOPY      Gynecologic History: Patient's last menstrual period was 06/20/2017.  Obstetric History: G3P1011  Family History:  Family History  Problem Relation Age of Onset  . Cardiomyopathy Maternal Grandmother   . Breast cancer Maternal Grandmother 436 . Stomach cancer Paternal Grandmother 721 . Colon cancer Maternal Grandfather        Lynch syndrome  . Ovarian cancer Maternal Aunt 30       lynch positive  . Uterine cancer Maternal Aunt     Social History:  Social History   Socioeconomic History  . Marital status: Married    Spouse name: Not on file  . Number of children: 1  . Years of education: Not on file  . Highest education level: Not on file  Social Needs  . Financial resource strain: Not on  file  . Food insecurity - worry: Not on file  . Food insecurity - inability: Not on file  . Transportation needs - medical: Not on file  . Transportation needs - non-medical: Not on file  Occupational History  . Occupation: Teacher-special education  Tobacco Use  . Smoking status: Never Smoker  . Smokeless tobacco: Never Used  Substance and Sexual Activity  . Alcohol use: Yes    Comment: occasional  . Drug use: No  . Sexual activity: Yes    Birth control/protection: None    Comment: trying to conceive  Other Topics Concern  . Not on file  Social History Narrative  . Not on file    Allergies:  Allergies  Allergen Reactions  . Aloe Vera [Aloe] Rash  . Penicillins Rash    As infant    Medications: Prior to Admission medications   Medication Sig Start Date End Date Taking? Authorizing Provider  Doxylamine-Pyridoxine (DICLEGIS) 10-10 MG TBEC Take 2 tablets by mouth at bedtime. If symptoms persist, add one tablet in the morning and one in the afternoon 25/36/14  Copland, Alicia B, PA-C  metoCLOPramide (REGLAN) 10 MG tablet Take 1 tablet (10 mg total) by mouth 3 (three) times daily before meals. 08/08/17   HGae Dry MD  Prenatal Vit-Fe Fumarate-FA (PRENATAL MULTIVITAMIN) TABS tablet Take 1 tablet by mouth daily at 12 noon.  [provider]  vitamin B-6 (PYRIDOXINE) 25 MG tablet Take 1 tablet (25 mg total) by mouth 3 (three) times daily. 08/08/17   Gae Dry, MD    Physical Exam Vitals: There were no vitals filed for this visit. Patient's last menstrual period was 06/20/2017.  General: NAD HEENT: normocephalic, anicteric Pulmonary: No increased work of breathing Neurologic: Grossly intact Psychiatric: mood appropriate, affect full  Female chaperone present for pelvic  portions of the physical exam  Assessment: 32 y.o. G3P1011 follow up for missed abortion  Plan: Problem List Items Addressed This Visit    None    Visit Diagnoses    Missed  abortion    -  Primary      We had discussed management options including expectant management, medical management, and surgical management as well as their relative success rates and complications at the patient's last visit.   We re-iterated the counseling from yesterday. Approximately 80% of first trimester miscarriages will pass successfully but may require a time frame of up to 8 weeks ([place] 150 May 2015 "Early Pregnancy Loss").  Medical management using 868mg of misoprostil administered every 3-hrs as needed for up to 3 doses speeds up the time frame to completion significantly, has literature supporting its use up to 63 days or 918w0destation and results in a passage rate of 84-85% (ACOG Practice Bulletin 143 March 2014 "Medical Management of First-Trimester Abortion").  Dilation and curettage has the highest rate of uterine evacuation, but carries with is operative cost, surgical and anesthetic risk.  While these risk are relatively small they nevertheless include infection, bleeding, uterine perforation, formation of uterine synechia, and in rare cases death.   After discussion of the management options with her husband they have decided to proceed with medical management.  The expected clinical course was discussed with the patient.  Rx were provided.  Routine bleeding precautions given.  As the patient is Rh positive rhogam is not indicated.  The patient is to contact me once she has completed the miscarriage and we will set her up for follow up ultrasound for verification.    AnMalachy MoodMD, FAPittmanB/GYN, [facility] 08/26/2017, 5:00 PM

## 2017-08-29 ENCOUNTER — Encounter: Payer: BC Managed Care – PPO | Admitting: Obstetrics and Gynecology

## 2017-09-01 ENCOUNTER — Telehealth: Payer: Self-pay | Admitting: Obstetrics and Gynecology

## 2017-09-01 NOTE — Telephone Encounter (Signed)
Patient is aware to arrive at Pre-admit Testing at 2:20pm. Patient is aware nothing to eat after midnight, water only until 12:20pm, and no prenatal vitamin ([facility] Per patient, she is not taking any other vitamins or medications.

## 2017-09-01 NOTE — Telephone Encounter (Signed)
Lmtrc

## 2017-09-02 ENCOUNTER — Ambulatory Visit
Admission: RE | Admit: 2017-09-02 | Discharge: 2017-09-02 | Disposition: A | Discharge status: Home / Self Care | Payer: BC Managed Care – PPO | Source: Ambulatory Visit | Attending: Obstetrics and Gynecology | Admitting: Obstetrics and Gynecology

## 2017-09-02 ENCOUNTER — Encounter
Admission: RE | Disposition: A | Discharge status: Home / Self Care | Payer: Self-pay | Source: Ambulatory Visit | Attending: Obstetrics and Gynecology

## 2017-09-02 ENCOUNTER — Ambulatory Visit: Payer: BC Managed Care – PPO | Admitting: Anesthesiology

## 2017-09-02 ENCOUNTER — Other Ambulatory Visit: Payer: Self-pay

## 2017-09-02 ENCOUNTER — Encounter: Payer: Self-pay | Admitting: *Deleted

## 2017-09-02 DIAGNOSIS — Z9889 Other specified postprocedural states: Secondary | ICD-10-CM

## 2017-09-02 DIAGNOSIS — O021 Missed abortion: Secondary | ICD-10-CM | POA: Diagnosis not present

## 2017-09-02 DIAGNOSIS — Z880 Allergy status to penicillin: Secondary | ICD-10-CM | POA: Insufficient documentation

## 2017-09-02 DIAGNOSIS — Z79899 Other long term (current) drug therapy: Secondary | ICD-10-CM | POA: Diagnosis not present

## 2017-09-02 HISTORY — PX: DILATION AND EVACUATION: SHX1459

## 2017-09-02 LAB — TYPE AND SCREEN
ABO/RH(D): A POS
Antibody Screen: NEGATIVE

## 2017-09-02 SURGERY — DILATION AND EVACUATION, UTERUS
Anesthesia: General | Site: Cervix | Wound class: Clean Contaminated

## 2017-09-02 MED ORDER — FAMOTIDINE 20 MG PO TABS
ORAL_TABLET | ORAL | Status: AC
  Administered 2017-09-02: 20 mg via ORAL
  Filled 2017-09-02: qty 1

## 2017-09-02 MED ORDER — KETOROLAC TROMETHAMINE 30 MG/ML IJ SOLN
INTRAMUSCULAR | Status: AC
  Filled 2017-09-02: qty 1

## 2017-09-02 MED ORDER — PROPOFOL 10 MG/ML IV BOLUS
INTRAVENOUS | Status: AC
  Filled 2017-09-02: qty 20

## 2017-09-02 MED ORDER — ONDANSETRON HCL 4 MG/2ML IJ SOLN: 4.0000 mg | Freq: Once | INTRAMUSCULAR | Status: DC | PRN

## 2017-09-02 MED ORDER — FENTANYL CITRATE (PF) 100 MCG/2ML IJ SOLN: 25.0000 ug | INTRAMUSCULAR | Status: DC | PRN

## 2017-09-02 MED ORDER — SUCCINYLCHOLINE CHLORIDE 20 MG/ML IJ SOLN
INTRAMUSCULAR | Status: AC
  Filled 2017-09-02: qty 1

## 2017-09-02 MED ORDER — ACETAMINOPHEN NICU IV SYRINGE 10 MG/ML
INTRAVENOUS | Status: AC
  Filled 2017-09-02: qty 1

## 2017-09-02 MED ORDER — LIDOCAINE HCL (CARDIAC) 20 MG/ML IV SOLN
INTRAVENOUS | Status: DC | PRN
  Administered 2017-09-02: 80 mg via INTRAVENOUS

## 2017-09-02 MED ORDER — MIDAZOLAM HCL 2 MG/2ML IJ SOLN
INTRAMUSCULAR | Status: DC | PRN
  Administered 2017-09-02: 2 mg via INTRAVENOUS

## 2017-09-02 MED ORDER — KETOROLAC TROMETHAMINE 30 MG/ML IJ SOLN
INTRAMUSCULAR | Status: DC | PRN
  Administered 2017-09-02: 30 mg via INTRAVENOUS

## 2017-09-02 MED ORDER — ONDANSETRON HCL 4 MG/2ML IJ SOLN
INTRAMUSCULAR | Status: DC | PRN
  Administered 2017-09-02: 4 mg via INTRAVENOUS

## 2017-09-02 MED ORDER — MIDAZOLAM HCL 2 MG/2ML IJ SOLN
INTRAMUSCULAR | Status: AC
  Filled 2017-09-02: qty 2

## 2017-09-02 MED ORDER — DOXYCYCLINE HYCLATE 100 MG IV SOLR
200.0000 mg | INTRAVENOUS | Status: AC
  Administered 2017-09-02: 200 mg via INTRAVENOUS
  Filled 2017-09-02 (×2): qty 200

## 2017-09-02 MED ORDER — LIDOCAINE HCL (PF) 2 % IJ SOLN
INTRAMUSCULAR | Status: AC
  Filled 2017-09-02: qty 10

## 2017-09-02 MED ORDER — DEXAMETHASONE SODIUM PHOSPHATE 10 MG/ML IJ SOLN
INTRAMUSCULAR | Status: DC | PRN
  Administered 2017-09-02: 5 mg via INTRAVENOUS

## 2017-09-02 MED ORDER — GLYCOPYRROLATE 0.2 MG/ML IJ SOLN
INTRAMUSCULAR | Status: AC
Start: 2017-09-02 — End: ?
  Filled 2017-09-02: qty 1

## 2017-09-02 MED ORDER — GLYCOPYRROLATE 0.2 MG/ML IJ SOLN
INTRAMUSCULAR | Status: DC | PRN
  Administered 2017-09-02: .1 mg via INTRAVENOUS

## 2017-09-02 MED ORDER — FENTANYL CITRATE (PF) 100 MCG/2ML IJ SOLN
INTRAMUSCULAR | Status: DC | PRN
  Administered 2017-09-02 (×2): 25 ug via INTRAVENOUS

## 2017-09-02 MED ORDER — PROPOFOL 10 MG/ML IV BOLUS
INTRAVENOUS | Status: DC | PRN
  Administered 2017-09-02: 170 mg via INTRAVENOUS

## 2017-09-02 MED ORDER — LACTATED RINGERS IV SOLN
INTRAVENOUS | Status: DC
  Administered 2017-09-02: 15:00:00 via INTRAVENOUS

## 2017-09-02 MED ORDER — ONDANSETRON HCL 4 MG/2ML IJ SOLN
INTRAMUSCULAR | Status: AC
  Filled 2017-09-02: qty 2

## 2017-09-02 MED ORDER — DEXAMETHASONE SODIUM PHOSPHATE 10 MG/ML IJ SOLN
INTRAMUSCULAR | Status: AC
  Filled 2017-09-02: qty 1

## 2017-09-02 MED ORDER — FENTANYL CITRATE (PF) 100 MCG/2ML IJ SOLN
INTRAMUSCULAR | Status: AC
  Filled 2017-09-02: qty 2

## 2017-09-02 MED ORDER — FAMOTIDINE 20 MG PO TABS
20.0000 mg | ORAL_TABLET | Freq: Once | ORAL | Status: AC
  Administered 2017-09-02: 20 mg via ORAL

## 2017-09-02 SURGICAL SUPPLY — 18 items
CATH ROBINSON RED A/P 16FR (CATHETERS) ×3 IMPLANT
FILTER UTR ASPR SPEC (MISCELLANEOUS) ×1 IMPLANT
FLTR UTR ASPR SPEC (MISCELLANEOUS) ×3
GLOVE BIO SURGEON STRL SZ7 (GLOVE) ×3 IMPLANT
GOWN STRL REUS W/ TWL LRG LVL3 (GOWN DISPOSABLE) ×2 IMPLANT
GOWN STRL REUS W/TWL LRG LVL3 (GOWN DISPOSABLE) ×4
KIT BERKELEY 1ST TRIMESTER 3/8 (MISCELLANEOUS) ×3 IMPLANT
KIT TURNOVER CYSTO (KITS) IMPLANT
NS IRRIG 500ML POUR BTL (IV SOLUTION) ×3 IMPLANT
PACK DNC HYST (MISCELLANEOUS) ×3 IMPLANT
PAD OB MATERNITY 4.3X12.25 (PERSONAL CARE ITEMS) ×3 IMPLANT
PAD PREP 24X41 OB/GYN DISP (PERSONAL CARE ITEMS) ×3 IMPLANT
SET BERKELEY SUCTION TUBING (SUCTIONS) ×3 IMPLANT
TOWEL OR 17X26 4PK STRL BLUE (TOWEL DISPOSABLE) ×3 IMPLANT
VACURETTE 10 RIGID CVD (CANNULA) IMPLANT
VACURETTE 12 RIGID CVD (CANNULA) IMPLANT
VACURETTE 8 RIGID CVD (CANNULA) IMPLANT
VACURETTE 8MM F TIP (MISCELLANEOUS) ×3 IMPLANT

## 2017-09-02 NOTE — Anesthesia Preprocedure Evaluation (Signed)
Anesthesia Evaluation  Patient identified by MRN, date of birth, ID band Patient awake    Reviewed: Allergy & Precautions, H&P , NPO status , Patient's Chart, lab work & pertinent test results, reviewed documented beta blocker date and time   Airway Mallampati: II  TM Distance: >3 FB Neck ROM: full    Dental  (+) Teeth Intact   Pulmonary neg pulmonary ROS,    Pulmonary exam normal        Cardiovascular Exercise Tolerance: Good negative cardio ROS Normal cardiovascular exam Rate:Normal     Neuro/Psych negative neurological ROS  negative psych ROS   GI/Hepatic negative GI ROS, Neg liver ROS,   Endo/Other  negative endocrine ROS  Renal/GU negative Renal ROS  negative genitourinary   Musculoskeletal   Abdominal   Peds  Hematology negative hematology ROS (+)   Anesthesia Other Findings   Reproductive/Obstetrics negative OB ROS                             Anesthesia Physical Anesthesia Plan  ASA: II  Anesthesia Plan: General LMA   Post-op Pain Management:    Induction:   PONV Risk Score and Plan:   Airway Management Planned:   Additional Equipment:   Intra-op Plan:   Post-operative Plan:   Informed Consent: I have reviewed the patients History and Physical, chart, labs and discussed the procedure including the risks, benefits and alternatives for the proposed anesthesia with the patient or authorized representative who has indicated his/her understanding and acceptance.       Plan Discussed with: CRNA  Anesthesia Plan Comments:         Anesthesia Quick Evaluation  

## 2017-09-02 NOTE — H&P (Signed)
Obstetrics & Gynecology Surgery H&P    Chief Complaint: Scheduled Surgery   History of Present Illness: Patient is a 32 y.o. G3P1011 presenting for scheduled suction D&C, for the treatment or further evaluation of missed abortion with CRL of [redacted]w[redacted]d   Prior Treatments prior to proceeding with surgery include: medical management with cytotec unsuccessful.  Has some light bleeding and cramping but no passage of tissue.  Review of Systems:10 point review of systems  Past Medical History:  Past Medical History:  Diagnosis Date  . 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 Lynch syndrome    per [facility] chart positive for MSH2- needs annual pelvic u/s, CA 125, endometrial bx, urine cytology, colonoscopy and endoscopy  . Lynch syndrome     Past Surgical History:  Past Surgical History:  Procedure Laterality Date  . COLONOSCOPY N/A 01/2014, 2017   HX of Lynch Syndrome (genetic)  . ENDOMETRIAL BIOPSY  2017   normal  . ESOPHAGOGASTRODUODENOSCOPY ENDOSCOPY      Family History:  Family History  Problem Relation Age of Onset  . Cardiomyopathy Maternal Grandmother   . Breast cancer Maternal Grandmother 468 . Stomach cancer Paternal Grandmother 732 . Colon cancer Maternal Grandfather        Lynch syndrome  . Ovarian cancer Maternal Aunt 30       lynch positive  . Uterine cancer Maternal Aunt     Social History:  Social History   Socioeconomic History  . Marital status: Married    Spouse name: Not on file  . Number of children: 1  . Years of education: Not on file  . Highest education level: Not on file  Social Needs  . Financial resource strain: Not on file  . Food insecurity - worry: Not on file  . Food insecurity - inability: Not on file  . Transportation needs - medical: Not on file  . Transportation needs - non-medical: Not on file  Occupational History  . Occupation: Teacher-special education  Tobacco Use  . Smoking status: Never Smoker  . Smokeless tobacco: Never Used    Substance and Sexual Activity  . Alcohol use: Yes    Comment: occasional  . Drug use: No  . Sexual activity: Yes    Birth control/protection: None    Comment: trying to conceive  Other Topics Concern  . Not on file  Social History Narrative  . Not on file    Allergies:  Allergies  Allergen Reactions  . Aloe Vera [Aloe] Rash  . Penicillins Rash    As infant Has patient had a PCN reaction causing immediate rash, facial/tongue/throat swelling, SOB or lightheadedness with hypotension: Unknown Has patient had a PCN reaction causing severe rash involving mucus membranes or skin necrosis: Unknown Has patient had a PCN reaction that required hospitalization: No Has patient had a PCN reaction occurring within the last 10 years: No If all of the above answers are "NO", then may proceed with Cephalosporin use.     Medications: Prior to Admission medications   Medication Sig Start Date End Date Taking? Authorizing Provider  Prenatal Vit-Fe Fumarate-FA (PRENATAL MULTIVITAMIN) TABS tablet Take 1 tablet by mouth at bedtime.    Yes [provider]  Doxylamine-Pyridoxine (DICLEGIS) 10-10 MG TBEC Take 2 tablets by mouth at bedtime. If symptoms persist, add one tablet in the morning and one in the afternoon Patient not taking: Reported on 09/01/2017 25/17/00  Copland, Alicia B, PA-C  ibuprofen (ADVIL,MOTRIN) 600 MG tablet Take  1 tablet (600 mg total) by mouth every 6 (six) hours as needed. Patient not taking: Reported on 09/01/2017 08/26/17   Malachy Mood, MD  metoCLOPramide (REGLAN) 10 MG tablet Take 1 tablet (10 mg total) by mouth 3 (three) times daily before meals. Patient not taking: Reported on 09/01/2017 08/08/17   Gae Dry, MD  misoprostol (CYTOTEC) 200 MCG tablet Place 4 tablets in between your gums and cheeks, repeat every 6-hrs as needed Patient not taking: Reported on 09/01/2017 08/26/17   Malachy Mood, MD  oxyCODONE-acetaminophen (PERCOCET/ROXICET) 5-325 MG tablet  Take 1-2 tablets by mouth every 6 (six) hours as needed. Patient not taking: Reported on 09/01/2017 08/26/17   Malachy Mood, MD  vitamin B-6 (PYRIDOXINE) 25 MG tablet Take 1 tablet (25 mg total) by mouth 3 (three) times daily. Patient not taking: Reported on 09/01/2017 08/08/17   Gae Dry, MD    Physical Exam Vitals: Blood pressure 109/84, pulse 75, temperature (!) 97.1 F (36.2 C), temperature source Temporal, resp. rate 18, height '5\' 4"'  (1.626 m), weight 188 lb (85.3 kg), last menstrual period 06/20/2017, SpO2 99 %, unknown if currently breastfeeding. General: NAD HEENT: normocephalic, anicteric Pulmonary: CTAB, No increased work of breathing Cardiovascular: RRR, distal pulses 2+ Abdomen: soft, non-tender Genitourinary: deferred Extremities: no edema, erythema, or tenderness Neurologic: Grossly intact Psychiatric: mood appropriate, affect full  Imaging US Ob Transvaginal  Result Date: 08/08/2017 ULTRASOUND REPORT Location: [facility] Date of Service: 08/08/2017 Indications:dating Findings: Nelda [place] intrauterine pregnancy is visualized with a CRL consistent with 32w1dgestation, giving an (U/S) EDD of 04/02/2018. The (U/S) EDD is not consistent with the clinically established EDD of 03/27/2018. FHR: 126 bpm CRL measurement: 4.3 mm Yolk sac is visualized and appears normal and early anatomy is normal. Amnion: not visualized Right Ovary is normal in appearance. Left Ovary is normal appearance. Corpus luteal cyst:  Left ovary Survey of the adnexa demonstrates no adnexal masses. There is no free peritoneal fluid in the cul de sac. Impression: 1. 620w1diable [place] Intrauterine pregnancy by U/S. 2. (U/S) EDD is NOT consistent with Clinically established EDD of 03/27/2018. Recommendations: 1.Clinical correlation with the patient's History and Physical Exam. 2. Follow up in 6 weeks for 1st Trimester Screening, if desired. KrEdwena BundeRDMS, RVT Review of ULTRASOUND.    [facility]  [facility]   Plan of management to be discussed with patient. PaBarnett ApplebaumMD, FALaytonsvilleb/Gyn, [facility] 08/08/2017  5:05 PM    Assessment: [age].o. G3P1011 presenting for scheduled suction D&C for missed abortion  Plan: 1) I have discussed with the patient the indications for the procedure.. After consideration of her history and findings, mutual decision has been made to proceed with D+C/hysteroscopy. While the incidence is low, the risks from this surgery include, but are not limited to, the risks of anesthesia, hemorrhage, infection, perforation, and injury to adjacent structures including bowel, bladder and blood vessels.  - doxycyline 2008mV preop  2) Routine postoperative instructions were reviewed with the patient and her family in detail today including the expected length of recovery and likely postoperative course.  The patient concurred with the proposed plan, giving informed written consent for the surgery today.  Patient instructed on the importance of being NPO after midnight prior to her procedure.  If warranted preoperative prophylactic antibiotics and SCDs ordered on call to the OR to meet SCIP guidelines and adhere to recommendation laid forth in  ACOG Practice Bulletin Number 104 May 2009  "Antibiotic Prophylaxis for Gynecologic Procedures".     Malachy Mood, MD, [facility], [facility] 09/02/2017, 4:11 PM

## 2017-09-02 NOTE — Anesthesia Procedure Notes (Signed)
Procedure Name: LMA Insertion Date/Time: 09/02/2017 4:30 PM Performed by: Karoline CaldwellStarr, Mavryk Pino, CRNA Pre-anesthesia Checklist: Patient identified, Patient being monitored, Timeout performed, Emergency Drugs available and Suction available Patient Re-evaluated:Patient Re-evaluated prior to induction Oxygen Delivery Method: Circle system utilized Preoxygenation: Pre-oxygenation with 100% oxygen Induction Type: IV induction Ventilation: Mask ventilation without difficulty LMA: LMA inserted LMA Size: 4.0 Tube type: Oral Number of attempts: 1 Placement Confirmation: positive ETCO2 and breath sounds checked- equal and bilateral Tube secured with: Tape Dental Injury: Teeth and Oropharynx as per pre-operative assessment

## 2017-09-02 NOTE — Anesthesia Post-op Follow-up Note (Signed)
Anesthesia QCDR form completed.        

## 2017-09-02 NOTE — Transfer of Care (Signed)
Immediate Anesthesia Transfer of Care Note  Patient: Jessica Cardenas  Procedure(s) Performed: DILATATION AND EVACUATION (N/A Cervix)  Patient Location: PACU  Anesthesia Type:General  Level of Consciousness: sedated  Airway & Oxygen Therapy: Patient Spontanous Breathing and Patient connected to face mask oxygen  Post-op Assessment: Report given to RN and Post -op Vital signs reviewed and stable  Post vital signs: Reviewed and stable  Last Vitals:  Vitals:   09/02/17 1457 09/02/17 1703  BP: 109/84 96/63  Pulse:  79  Resp:  14  Temp:  37.1 C  SpO2:  100%    Last Pain:  Vitals:   09/02/17 1703  TempSrc:   PainSc: Asleep         Complications: No apparent anesthesia complications

## 2017-09-02 NOTE — Discharge Instructions (Signed)

## 2017-09-02 NOTE — Op Note (Signed)
Preoperative Diagnosis: 1) 32 y.o. with 8 week missed abortion  Postoperative Diagnosis: 1) 32 y.o. with 8 week missed abortion  Operation Performed: Suction dilation and curettage  Indication: Missed abortion, failed medical management  Anesthesia: General  Primary Surgeon: Vena AustriaAndreas Mitali Shenefield, MD  Assistant: none  Preoperative Antibiotics: none  Estimated Blood Loss: 100 mL  IV Fluids: 400mL  Urine Output:: ~425mL straight cath  Drains or Tubes: none  Implants: none  Specimens Removed: Products of conception  Complications: none  Intraoperative Findings:  Cervix approximately 1cm dilated, no active bleeding, uterus 8 week size retroflexed  Patient Condition: stable  Procedure in Detail:  Patient was taken to the operating room were she was administered general endotracheal anesthesia.  She was positioned in the dorsal lithotomy position utilizing Allen stirups, prepped and draped in the usual sterile fashion.  Uterus was noted to be 8 weeks in size, retroflexed.   Prior to proceeding with the case a time out was performed.  Attention was turned to the patient's pelvis.  A red rubber catheter was used to empty the patient's bladder.  An operative speculum was placed to allow visualization of the cervix.  The anterior lip of the cervix was grasped with a single tooth tenaculum and the cervix was sequentially dilated using pratt dilators.  A size 8 flexible suction curette was then advanced to the uterine fundus.  Several passes were undertaken to clear the uterine contents.  Sharp curettage was then  performed nothing good uterine cry throughout the cavity.  A final pass of the suction curette was then undertaken.  The resulting specimen was sent to pathology.    The single tooth tenaculum was removed from the cervix.  The tenaculum sites and cervix were noted to be  Hemostatic before removing the operative speculum.  Sponge needle and instrument counts were corrects times two.  The  patient tolerated the procedure well and was taken to the recovery room in stable condition.

## 2017-09-02 NOTE — Anesthesia Postprocedure Evaluation (Signed)
Anesthesia Post Note  Patient: Pilar PlateStephanie Gilvin  Procedure(s) Performed: DILATATION AND EVACUATION (N/A Cervix)  Patient location during evaluation: PACU Anesthesia Type: General Level of consciousness: awake and alert Pain management: pain level controlled Vital Signs Assessment: post-procedure vital signs reviewed and stable Respiratory status: spontaneous breathing, nonlabored ventilation, respiratory function stable and patient connected to nasal cannula oxygen Cardiovascular status: blood pressure returned to baseline and stable Postop Assessment: no apparent nausea or vomiting Anesthetic complications: no     Last Vitals:  Vitals:   09/02/17 1749 09/02/17 1805  BP: 100/72 105/60  Pulse: 68 100  Resp: 20 18  Temp: (!) 36.3 C   SpO2: 100% 100%    Last Pain:  Vitals:   09/02/17 1749  TempSrc: Temporal  PainSc: 1                  Wayne Wicklund S

## 2017-09-03 ENCOUNTER — Encounter: Payer: Self-pay | Admitting: Obstetrics and Gynecology

## 2017-09-04 LAB — SURGICAL PATHOLOGY

## 2017-09-10 ENCOUNTER — Ambulatory Visit (INDEPENDENT_AMBULATORY_CARE_PROVIDER_SITE_OTHER): Payer: BC Managed Care – PPO | Admitting: Obstetrics and Gynecology

## 2017-09-10 ENCOUNTER — Encounter: Payer: Self-pay | Admitting: Obstetrics and Gynecology

## 2017-09-10 VITALS — BP 116/78 | HR 75 | Wt 186.0 lb

## 2017-09-10 DIAGNOSIS — Z4889 Encounter for other specified surgical aftercare: Secondary | ICD-10-CM

## 2017-09-10 NOTE — Progress Notes (Signed)
      Postoperative Follow-up Patient presents post op from suction D&C 1weeks ago for missed abortion.  Subjective: Patient reports marked improvement in her preop symptoms. Eating a regular diet without difficulty. The patient is not having any pain.  Activity: normal activities of daily living.  Objective: Blood pressure 116/78, pulse 75, weight 186 lb (84.4 kg), last menstrual period 06/20/2017, unknown if currently breastfeeding.  Admission on 09/02/2017, Discharged on 09/02/2017  Component Date Value Ref Range Status  . ABO/RH(D) 09/02/2017 A POS   Final  . Antibody Screen 09/02/2017 NEG   Final  . Sample Expiration 09/02/2017    Final                   Value:09/05/2017 Performed at [facility], [address] [place], [place] [address]   . SURGICAL PATHOLOGY 09/02/2017    Final                   Value:Surgical Pathology CASE: [id number] PATIENT: [facility] Surgical Pathology Report     SPECIMEN SUBMITTED: A. Products of conception  CLINICAL HISTORY: None provided  PRE-OPERATIVE DIAGNOSIS: Missed abortion  POST-OPERATIVE DIAGNOSIS: Same as pre-op     DIAGNOSIS: A. PRODUCTS OF CONCEPTION; DILATION AND EVACUATION: - DECIDUA AND CHORIONIC VILLI, CONSISTENT WITH PRODUCTS OF CONCEPTION.   GROSS DESCRIPTION:  A. Labeled: proximal of conception  Tissue fragment(s): multiple  Size: aggregate, 12.5 x 10.2 x 1.5 cm  Villous tissue: tissue suggestive of villous tissue  Fetal tissue: none grossly identified  Comment: received in formalin  Block summary: 1- representative sections  Final Diagnosis performed by Glenice Bowana Baker, MD.  Electronically signed 09/04/2017 7:14:34PM    The electronic signature indicates that the named Attending Pathologist has evaluated the specimen  Technical component performed at [facility], [address], [place], [place] [address] Lab: [phone] Dir: Jolene SchimkeSanjai Nagendra, MD,  MMM  Professional component performed at [facility], [facility], [address], [place], [place] [address] Lab: [phone] Dir: Georgiann Cockerara C. Rubinas, MD      Assessment: 32 y.o. s/p suction D&C stable  Plan: Patient has done well after surgery with no apparent complications.  I have discussed the post-operative course to date, and the expected progress moving forward.  The patient understands what complications to be concerned about.  I will see the patient in routine follow up, or sooner if needed.    Activity plan: pelvic rest  Doing well physically.  Given info faces of loss Discussed grieving/bereavement process   Vena AustriaAndreas Carnel Stegman, MD, Merlinda FrederickFACOG [facility], [facility] 09/11/2017, 9:45 PM

## 2017-09-12 ENCOUNTER — Encounter: Payer: BC Managed Care – PPO | Admitting: Obstetrics and Gynecology

## 2017-09-12 ENCOUNTER — Other Ambulatory Visit: Payer: BC Managed Care – PPO

## 2017-09-17 ENCOUNTER — Encounter: Payer: BC Managed Care – PPO | Admitting: Obstetrics & Gynecology

## 2017-09-17 ENCOUNTER — Other Ambulatory Visit: Payer: BC Managed Care – PPO

## 2017-10-20 ENCOUNTER — Ambulatory Visit (INDEPENDENT_AMBULATORY_CARE_PROVIDER_SITE_OTHER): Payer: BC Managed Care – PPO | Admitting: Obstetrics and Gynecology

## 2017-10-20 ENCOUNTER — Encounter: Payer: Self-pay | Admitting: Obstetrics and Gynecology

## 2017-10-20 VITALS — BP 102/70 | HR 79 | Ht 64.0 in | Wt 186.0 lb

## 2017-10-20 DIAGNOSIS — N96 Recurrent pregnancy loss: Secondary | ICD-10-CM

## 2017-10-20 NOTE — Progress Notes (Signed)
      Postoperative Follow-up Patient presents post op from suction D&C 6weeks ago for missed abortion.  Subjective: Patient reports marked improvement in her preop symptoms. Eating a regular diet without difficulty. The patient is not having any pain.  Activity: normal activities of daily living.  Objective: Blood pressure 102/70, pulse 79, height  (1.626 m), weight 186 lb (84.4 kg), last menstrual period 10/03/2017, not currently breastfeeding.  Gen: NAD HEENT: normocephalic, anicteric Pulmonary: no increased work of breathing GU: normal external female genitalia, normal cervix, uterus non-enlarged, non-tender Ext: no edema  Admission on 09/02/2017, Discharged on 09/02/2017  Component Date Value Ref Range Status  . ABO/RH(D) 09/02/2017 A POS   Final  . Antibody Screen 09/02/2017 NEG   Final  . Sample Expiration 09/02/2017    Final                   Value:09/05/2017 Performed at [facility], [address], [place], [place] [address]   . SURGICAL PATHOLOGY 09/02/2017    Final                   Value:Surgical Pathology CASE: [id number] PATIENT: [facility] Surgical Pathology Report     SPECIMEN SUBMITTED: A. Products of conception  CLINICAL HISTORY: None provided  PRE-OPERATIVE DIAGNOSIS: Missed abortion  POST-OPERATIVE DIAGNOSIS: Same as pre-op     DIAGNOSIS: A. PRODUCTS OF CONCEPTION; DILATION AND EVACUATION: - DECIDUA AND CHORIONIC VILLI, CONSISTENT WITH PRODUCTS OF CONCEPTION.   GROSS DESCRIPTION:  A. Labeled: proximal of conception  Tissue fragment(s): multiple  Size: aggregate, 12.5 x 10.2 x 1.5 cm  Villous tissue: tissue suggestive of villous tissue  Fetal tissue: none grossly identified  Comment: received in formalin  Block summary: 1- representative sections  Final Diagnosis performed by Glenice Bow, MD.  Electronically signed 09/04/2017 7:14:34PM    The electronic signature indicates that the named  Attending Pathologist has evaluated the specimen  Technical component performed at [facility], [address], [place], [place] [address] Lab: [phone] Dir: Jolene Schimke, MD, MMM  Professional component performed at [facility], [facility], [address] [place], [place], [place] [address] Lab: [phone] Dir: Georgiann Cocker. Rubinas, MD      Assessment: 32 y.o. s/p suction D&C stable  Plan: Patient has done well after surgery with no apparent complications.  I have discussed the post-operative course to date, and the expected progress moving forward.  The patient understands what complications to be concerned about.  I will see the patient in routine follow up, or sooner if needed.    Activity plan: No restriction.  - second miscarriage labs ordered   Vena Austria, MD, [facility], [facility], [facility] 10/21/2017, 10:26 AM

## 2017-10-21 ENCOUNTER — Other Ambulatory Visit: Payer: BC Managed Care – PPO

## 2017-10-21 DIAGNOSIS — N96 Recurrent pregnancy loss: Secondary | ICD-10-CM

## 2017-10-25 LAB — FACTOR V LEIDEN

## 2017-10-25 LAB — CARDIOLIPIN ANTIBODIES, IGG, IGM, IGA: ANTICARDIOLIPIN IGM: 9 [MPL'U]/mL (ref 0–12)

## 2017-10-25 LAB — TSH: TSH: 2.96 u[IU]/mL (ref 0.450–4.500)

## 2017-10-25 LAB — BETA-2-GLYCOPROTEIN I ABS, IGG/M/A: Beta-2 Glyco 1 IgM: <9 GPI IgM units (ref 0–32)

## 2017-10-25 LAB — ANTITHROMBIN III: ANTITHROMB III FUNC: 109 % (ref 75–135)

## 2017-11-02 ENCOUNTER — Other Ambulatory Visit: Payer: Self-pay | Admitting: Obstetrics and Gynecology

## 2017-11-02 DIAGNOSIS — O3680X0 Pregnancy with inconclusive fetal viability, not applicable or unspecified: Secondary | ICD-10-CM

## 2017-11-02 DIAGNOSIS — N96 Recurrent pregnancy loss: Secondary | ICD-10-CM

## 2017-11-02 DIAGNOSIS — Z8759 Personal history of other complications of pregnancy, childbirth and the puerperium: Secondary | ICD-10-CM

## 2017-11-03 ENCOUNTER — Other Ambulatory Visit: Payer: BC Managed Care – PPO

## 2017-11-03 DIAGNOSIS — O3680X0 Pregnancy with inconclusive fetal viability, not applicable or unspecified: Secondary | ICD-10-CM

## 2017-11-03 DIAGNOSIS — Z8759 Personal history of other complications of pregnancy, childbirth and the puerperium: Secondary | ICD-10-CM

## 2017-11-03 DIAGNOSIS — N96 Recurrent pregnancy loss: Secondary | ICD-10-CM

## 2017-11-04 ENCOUNTER — Encounter: Payer: Self-pay | Admitting: Obstetrics and Gynecology

## 2017-11-04 ENCOUNTER — Ambulatory Visit (INDEPENDENT_AMBULATORY_CARE_PROVIDER_SITE_OTHER): Payer: Self-pay | Admitting: Obstetrics and Gynecology

## 2017-11-04 VITALS — BP 126/80 | HR 82 | Wt 184.0 lb

## 2017-11-04 DIAGNOSIS — O0281 Inappropriate change in quantitative human chorionic gonadotropin (hCG) in early pregnancy: Secondary | ICD-10-CM

## 2017-11-04 LAB — BETA HCG QUANT (REF LAB)

## 2017-11-04 NOTE — Progress Notes (Signed)
Obstetric Problem Visit    Chief Complaint:  Chief Complaint  Patient presents with  . Vaginal Bleeding    positive home test 5/11    History of Present Illness: Patient is a 32 y.o. [id number] Unknown presenting for first trimester bleeding.  The onset of bleeding was yesterday.  Is bleeding equal to or greater than normal menstrual flow:  Yes Any recent trauma:  No Recent intercourse:  No History of prior miscarriage:  Yes Prior ultrasound demonstrating IUP: none.  Prior ultrasound demonstrating viable IUP:  No Prior Serum HCG:  <1 mIU/mL 11/03/2017  Review of Systems: Review of Systems  Constitutional: Negative.   HENT: Negative.   Eyes: Negative.   Respiratory: Negative.   Cardiovascular: Negative.   Gastrointestinal: Negative.   Genitourinary: Negative.   Musculoskeletal: Negative.   Skin: Negative.   Neurological: Negative.   Endo/Heme/Allergies: Negative.   Psychiatric/Behavioral: Negative.     Past Medical History:  Past Medical History:  Diagnosis Date  . 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 Lynch syndrome    per [facility] chart positive for MSH2- needs annual pelvic u/s, CA 125, endometrial bx, urine cytology, colonoscopy and endoscopy  . Lynch syndrome     Past Surgical History:  Past Surgical History:  Procedure Laterality Date  . COLONOSCOPY N/A 01/2014, 2017   HX of Lynch Syndrome (genetic)  . DILATION AND EVACUATION N/A 09/02/2017   Procedure: DILATATION AND EVACUATION;  Surgeon: Malachy Mood, MD;  Location: [facility] ORS;  Service: Gynecology;  Laterality: N/A;  . ENDOMETRIAL BIOPSY  2017   normal  . ESOPHAGOGASTRODUODENOSCOPY ENDOSCOPY      Obstetric History: G3P1011  Family History:  Family History  Problem Relation Age of Onset  . Cardiomyopathy Maternal Grandmother   . Breast cancer Maternal Grandmother 61  . Stomach cancer Paternal Grandmother 1  . Colon cancer Maternal Grandfather        Lynch syndrome  . Ovarian cancer Maternal Aunt 30         lynch positive  . Uterine cancer Maternal Aunt     Social History:  Social History   Socioeconomic History  . Marital status: Married    Spouse name: Not on file  . Number of children: 1  . Years of education: Not on file  . Highest education level: Not on file  Occupational History  . Occupation: Teacher-special education  Social Needs  . Financial resource strain: Not on file  . Food insecurity:    Worry: Not on file    Inability: Not on file  . Transportation needs:    Medical: Not on file    Non-medical: Not on file  Tobacco Use  . Smoking status: Never Smoker  . Smokeless tobacco: Never Used  Substance and Sexual Activity  . Alcohol use: Yes    Comment: occasional  . Drug use: No  . Sexual activity: Yes    Birth control/protection: None    Comment: trying to conceive  Lifestyle  . Physical activity:    Days per week: 5 days    Minutes per session: 40 min  . Stress: Only a little  Relationships  . Social connections:    Talks on phone: Once a week    Gets together: Once a week    Attends religious service: Never    Active member of club or organization: No    Attends meetings of clubs or organizations: Never    Relationship status: Married  . Intimate partner violence:  Fear of current or ex partner: No    Emotionally abused: No    Physically abused: No    Forced sexual activity: No  Other Topics Concern  . Not on file  Social History Narrative  . Not on file    Allergies:  Allergies  Allergen Reactions  . Aloe Vera [Aloe] Rash  . Penicillins Rash    As infant Has patient had a PCN reaction causing immediate rash, facial/tongue/throat swelling, SOB or lightheadedness with hypotension: Unknown Has patient had a PCN reaction causing severe rash involving mucus membranes or skin necrosis: Unknown Has patient had a PCN reaction that required hospitalization: No Has patient had a PCN reaction occurring within the last 10 years: No If all of  the above answers are "NO", then may proceed with Cephalosporin use.     Medications: Prior to Admission medications   Medication Sig Start Date End Date Taking? Authorizing Provider  prenatal vitamin w/FE, FA (NATACHEW) 29-1 MG CHEW chewable tablet Chew 1 tablet by mouth daily at 12 noon.   Yes [provider]    Physical Exam Vitals: Blood pressure 126/80, pulse 82, weight 184 lb (83.5 kg), last menstrual period 10/03/2017. General: NAD HEENT: normocephalic, anicteric Pulmonary: No increased work of breathing, Neurologic: Grossly intact Psychiatric: mood appropriate, affect full  Assessment: 32 y.o. [id number] Unknown presenting for evaluation of first trimester vaginal bleeding  Plan: Problem List Items Addressed This Visit    None    Visit Diagnoses    Chemical pregnancy    -  Primary      1) First trimester bleeding -  Based on positive UPT with negative serum HCG at onset of bleeding this likely represents a chemical pregnancy with failed implantation.  Implications discussed.  I also iterated the normal result of work up with no identifiable cause that would increase the patient risk for recurrent pregnancy loss.  2) Several studies have examined and established a role for emotional support and stress reduction in improving pregnancy outcomes for patient with recurrent miscarriage. The tender love and care model utilizes weekly to twice weekly follow up, more frequent ultrasound, and has consistently demonstrated improved live birth rates in several studies.  The risk of miscarriage following documentation of fetal heart tones drops significantly to approximately 3%.  3) Call with next positive urine pregnancy test.  4) A total of 15 minutes were spent in face-to-face contact with the patient during this encounter with over half of that time devoted to counseling and coordination of care.  5) Return if symptoms worsen or fail to improve.   Malachy Mood, MD,  [facility], [facility] 11/04/2017, 11:49 AM

## 2017-11-05 ENCOUNTER — Other Ambulatory Visit: Payer: BC Managed Care – PPO

## 2018-02-02 ENCOUNTER — Telehealth: Payer: Self-pay | Admitting: Obstetrics and Gynecology

## 2018-02-02 ENCOUNTER — Other Ambulatory Visit: Payer: BC Managed Care – PPO

## 2018-02-02 ENCOUNTER — Other Ambulatory Visit: Payer: Self-pay | Admitting: Obstetrics and Gynecology

## 2018-02-02 DIAGNOSIS — Z8759 Personal history of other complications of pregnancy, childbirth and the puerperium: Secondary | ICD-10-CM

## 2018-02-02 DIAGNOSIS — O3680X0 Pregnancy with inconclusive fetal viability, not applicable or unspecified: Secondary | ICD-10-CM

## 2018-02-02 NOTE — Telephone Encounter (Signed)
-----   Message from Vena AustriaAndreas Staebler, MD sent at 02/02/2018  8:08 AM EDT ----- Regarding: Lab Needs labs today 8/12  and Wednesday 8/14

## 2018-02-02 NOTE — Telephone Encounter (Signed)
Patient is schedule 02/02/18 at 2:20 and 02/04/18 2:20 for [facility]

## 2018-02-03 LAB — BETA HCG QUANT (REF LAB): HCG QUANT: 164 m[IU]/mL

## 2018-02-04 ENCOUNTER — Other Ambulatory Visit: Payer: BC Managed Care – PPO

## 2018-02-04 DIAGNOSIS — O3680X0 Pregnancy with inconclusive fetal viability, not applicable or unspecified: Secondary | ICD-10-CM

## 2018-02-04 DIAGNOSIS — Z8759 Personal history of other complications of pregnancy, childbirth and the puerperium: Secondary | ICD-10-CM

## 2018-02-04 DIAGNOSIS — N96 Recurrent pregnancy loss: Secondary | ICD-10-CM

## 2018-02-05 LAB — BETA HCG QUANT (REF LAB): HCG QUANT: 325 m[IU]/mL

## 2018-02-06 NOTE — Progress Notes (Signed)
Needs NOB with me sometime next week

## 2018-02-10 ENCOUNTER — Ambulatory Visit (INDEPENDENT_AMBULATORY_CARE_PROVIDER_SITE_OTHER): Payer: BC Managed Care – PPO | Admitting: Obstetrics and Gynecology

## 2018-02-10 ENCOUNTER — Encounter: Payer: Self-pay | Admitting: Obstetrics and Gynecology

## 2018-02-10 VITALS — BP 106/78 | Wt 190.0 lb

## 2018-02-10 DIAGNOSIS — O09529 Supervision of elderly multigravida, unspecified trimester: Secondary | ICD-10-CM

## 2018-02-10 DIAGNOSIS — Z3A01 Less than 8 weeks gestation of pregnancy: Secondary | ICD-10-CM

## 2018-02-10 DIAGNOSIS — Z3481 Encounter for supervision of other normal pregnancy, first trimester: Secondary | ICD-10-CM

## 2018-02-10 DIAGNOSIS — O0990 Supervision of high risk pregnancy, unspecified, unspecified trimester: Secondary | ICD-10-CM

## 2018-02-10 NOTE — Progress Notes (Signed)
New Obstetric Patient H&P    Chief Complaint: "Desires prenatal care"   History of Present Illness: Patient is a 32 y.o. [id number] Not Hispanic or Latino female, presents with amenorrhea and positive home pregnancy test. Patient's last menstrual period was 01/04/2018 (exact date). and based on her  LMP, her EDD is Estimated Date of Delivery: 10/11/18 and her EGA is [redacted]w[redacted]d  Her last pap smear was on 05/27/2017 and was NIL HPV negative.    She had a urine pregnancy test which was positive 1 week(s)  ago. Her last menstrual period was normal. Since her LMP she claims she has experienced fatigue, breast tenderness. She denies vaginal bleeding. Her past medical history is noncontributory. Her prior pregnancies are notable for none  Since her LMP, she admits to the use of tobacco products  no She claims she has gained   no pounds since the start of her pregnancy.  There are cats in the home in the home  yes If yes, does not change liter box She admits close contact with children on a regular basis  yes  She has had chicken pox in the past yes She has had Tuberculosis exposures, symptoms, or previously tested positive for TB   no Current or past history of domestic violence. no  Genetic Screening/Teratology Counseling: (Includes patient, baby's father, or anyone in either family with:)   131 [facility] yes 2. Thalassemia ([place] or Asian background): MCV<80  no 3. Neural tube defect (meningomyelocele, spina bifida, anencephaly)  no 4. Congenital heart defect  no  5. Down syndrome  no 6. Tay-Sachs (Jewish, [place]  no 7. Canavan's Disease  no 8. Sickle cell disease or trait (African)  no  9. Hemophilia or other blood disorders  no  10. Muscular dystrophy  no  11. Cystic fibrosis  no  12. Huntington's Chorea  no  13. Mental retardation/autism  no 14. Other inherited genetic or chromosomal disorder  no 15. Maternal metabolic disorder (DM, PKU,  etc)  no 16. Patient or FOB with a child with a birth defect not listed above no  16a. Patient or FOB with a birth defect themselves no 17. Recurrent pregnancy loss, or stillbirth  no  18. Any medications since LMP other than prenatal vitamins (include vitamins, supplements, OTC meds, drugs, alcohol)  no 19. Any other genetic/environmental exposure to discuss  no  Infection History:   1. Lives with someone with TB or TB exposed  no  2. Patient or partner has history of genital herpes  no 3. Rash or viral illness since LMP  no 4. History of STI (GC, CT, HPV, syphilis, HIV)  no 5. History of recent travel :  no  Other pertinent information:  no     Review of Systems:10 point review of systems negative unless otherwise noted in HPI  Past Medical History:  Past Medical History:  Diagnosis Date  . 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 Lynch syndrome    per [facility] chart positive for MSH2- needs annual pelvic u/s, CA 125, endometrial bx, urine cytology, colonoscopy and endoscopy  . Lynch syndrome     Past Surgical History:  Past Surgical History:  Procedure Laterality Date  . COLONOSCOPY N/A 01/2014, 2017   HX of Lynch Syndrome (genetic)  . DILATION AND EVACUATION N/A 09/02/2017   Procedure: DILATATION AND EVACUATION;  Surgeon: SMalachy Mood MD;  Location: [facility] ORS;  Service: Gynecology;  Laterality: N/A;  .  ENDOMETRIAL BIOPSY  2017   normal  . ESOPHAGOGASTRODUODENOSCOPY ENDOSCOPY      Gynecologic History: Patient's last menstrual period was 01/04/2018 (exact date).  Obstetric History: [id number]  Family History:  Family History  Problem Relation Age of Onset  . Cardiomyopathy Maternal Grandmother   . Breast cancer Maternal Grandmother 37  . Stomach cancer Paternal Grandmother 29  . Colon cancer Maternal Grandfather        Lynch syndrome  . Ovarian cancer Maternal Aunt 30       lynch positive  . Uterine cancer Maternal Aunt     Social History:  Social History    Socioeconomic History  . Marital status: Married    Spouse name: Not on file  . Number of children: 1  . Years of education: Not on file  . Highest education level: Not on file  Occupational History  . Occupation: Teacher-special education  Social Needs  . Financial resource strain: Not on file  . Food insecurity:    Worry: Not on file    Inability: Not on file  . Transportation needs:    Medical: Not on file    Non-medical: Not on file  Tobacco Use  . Smoking status: Never Smoker  . Smokeless tobacco: Never Used  Substance and Sexual Activity  . Alcohol use: Yes    Comment: occasional  . Drug use: No  . Sexual activity: Yes    Birth control/protection: None    Comment: trying to conceive  Lifestyle  . Physical activity:    Days per week: 5 days    Minutes per session: 40 min  . Stress: Only a little  Relationships  . Social connections:    Talks on phone: Once a week    Gets together: Once a week    Attends religious service: Never    Active member of club or organization: No    Attends meetings of clubs or organizations: Never    Relationship status: Married  . Intimate partner violence:    Fear of current or ex partner: No    Emotionally abused: No    Physically abused: No    Forced sexual activity: No  Other Topics Concern  . Not on file  Social History Narrative  . Not on file    Allergies:  Allergies  Allergen Reactions  . Aloe Vera [Aloe] Rash  . Penicillins Rash    As infant Has patient had a PCN reaction causing immediate rash, facial/tongue/throat swelling, SOB or lightheadedness with hypotension: Unknown Has patient had a PCN reaction causing severe rash involving mucus membranes or skin necrosis: Unknown Has patient had a PCN reaction that required hospitalization: No Has patient had a PCN reaction occurring within the last 10 years: No If all of the above answers are "NO", then may proceed with Cephalosporin use.     Medications: Prior  to Admission medications   Medication Sig Start Date End Date Taking? Authorizing Provider  prenatal vitamin w/FE, FA (NATACHEW) 29-1 MG CHEW chewable tablet Chew 1 tablet by mouth daily at 12 noon.   Yes [provider]    Physical Exam Vitals: Blood pressure 106/78, weight 190 lb (86.2 kg), last menstrual period 01/04/2018.  General: NAD HEENT: normocephalic, anicteric Thyroid: no enlargement, no palpable nodules Pulmonary: No increased work of breathing, CTAB Cardiovascular: RRR, distal pulses 2+ Abdomen: NABS, soft, non-tender, non-distended.  Umbilicus without lesions.  No hepatomegaly, splenomegaly or masses palpable. No evidence of hernia  Genitourinary:  External: Normal external  female genitalia.  Normal urethral meatus, normal  Bartholin's and Skene's glands.    Vagina: Normal vaginal mucosa, no evidence of prolapse.    Cervix: Grossly normal in appearance, no bleeding  Uterus: Non-enlarged, mobile, normal contour.  No CMT.  Bedside ultrasound showing early intrauterine gestational sac with small yolk sac  Adnexa: ovaries non-enlarged, no adnexal masses  Rectal: deferred Extremities: no edema, erythema, or tenderness Neurologic: Grossly intact Psychiatric: mood appropriate, affect full   Assessment: 32 y.o. G4P1011 at 43w3dpresenting to initiate prenatal care  Plan: 1) Avoid alcoholic beverages. 2) Patient encouraged not to smoke.  3) Discontinue the use of all non-medicinal drugs and chemicals.  4) Take prenatal vitamins daily.  5) Nutrition, food safety (fish, cheese advisories, and high nitrite foods) and exercise discussed. 6) Hospital and practice style discussed with cross coverage system.  7) Genetic Screening, such as with 1st Trimester Screening, cell free fetal DNA, AFP testing, and Ultrasound, as well as with amniocentesis and CVS as appropriate, is discussed with patient. At the conclusion of today's visit patient requested genetic testing 8) Patient  is asked about travel to areas at risk for the Zika virus, and counseled to avoid travel and exposure to mosquitoes or sexual partners who may have themselves been exposed to the virus. Testing is discussed, and will be ordered as appropriate. 9) Follow up 1 week ROB 10) Several studies have examined and established a role for emotional support and stress reduction in improving pregnancy outcomes for patient with recurrent miscarriage. The tender love and care model utilizes weekly to twice weekly follow up, more frequent ultrasound, and has consistently demonstrated improved live birth rates in several studies.  The risk of miscarriage following documentation of fetal heart tones drops significantly to approximately 3%.    AMalachy Mood MD, [facility] 02/11/2018, 1:06 PM

## 2018-02-10 NOTE — Progress Notes (Signed)
NOB 

## 2018-02-11 DIAGNOSIS — O0990 Supervision of high risk pregnancy, unspecified, unspecified trimester: Secondary | ICD-10-CM | POA: Insufficient documentation

## 2018-02-11 DIAGNOSIS — O09529 Supervision of elderly multigravida, unspecified trimester: Secondary | ICD-10-CM | POA: Insufficient documentation

## 2018-02-20 ENCOUNTER — Ambulatory Visit (INDEPENDENT_AMBULATORY_CARE_PROVIDER_SITE_OTHER): Payer: BC Managed Care – PPO | Admitting: Obstetrics and Gynecology

## 2018-02-20 VITALS — BP 112/68 | Wt 194.0 lb

## 2018-02-20 DIAGNOSIS — O0990 Supervision of high risk pregnancy, unspecified, unspecified trimester: Secondary | ICD-10-CM

## 2018-02-20 DIAGNOSIS — Z3A01 Less than 8 weeks gestation of pregnancy: Secondary | ICD-10-CM

## 2018-02-20 DIAGNOSIS — O09529 Supervision of elderly multigravida, unspecified trimester: Secondary | ICD-10-CM

## 2018-02-20 LAB — POCT URINALYSIS DIPSTICK OB
GLUCOSE, UA: NEGATIVE
POC,PROTEIN,UA: NEGATIVE

## 2018-02-20 NOTE — Progress Notes (Signed)
Jessica Cardenas

## 2018-02-21 NOTE — Progress Notes (Signed)
    Routine Prenatal Care Visit  Subjective  Jessica Cardenas is a 32 y.o. G4P1011 at 6957w6d being seen today for ongoing prenatal care.  She is currently monitored for the following issues for this low-risk pregnancy and has Lynch syndrome; History of cervical dysplasia; Supervision of high risk pregnancy, antepartum; and Advanced maternal age in multigravida on their problem list.  ----------------------------------------------------------------------------------- Patient reports no complaints.   Contractions: Not present. Vag. Bleeding: None.  Movement: Absent. Denies leaking of fluid.  ----------------------------------------------------------------------------------- The following portions of the patient's history were reviewed and updated as appropriate: allergies, current medications, past family history, past medical history, past social history, past surgical history and problem list. Problem list updated.   Objective  Blood pressure 112/68, weight 194 lb (88 kg), last menstrual period 01/04/2018. Pregravid weight 190 lb (86.2 kg) Total Weight Gain 4 lb (1.814 kg) Urinalysis:      Fetal Status: Fetal Heart Rate (bpm): 150   Movement: Absent     General:  Alert, oriented and cooperative. Patient is in no acute distress.  Skin: Skin is warm and dry. No rash noted.   Cardiovascular: Normal heart rate noted  Respiratory: Normal respiratory effort, no problems with respiration noted  Abdomen: Soft, gravid, appropriate for gestational age. Pain/Pressure: Absent     Pelvic:  Cervical exam deferred        Extremities: Normal range of motion.     ental Status: Normal mood and affect. Normal behavior. Normal judgment and thought content.     Assessment   32 y.o. G4P1011 at 5157w6d by  10/11/2018, by Last Menstrual Period presenting for routine prenatal visit  Plan   Pregnancy#4 Problems (from 01/04/18 to present)    Problem Noted Resolved   Supervision of high risk pregnancy,  antepartum 02/11/2018 by Vena AustriaStaebler, Joquan Lotz, MD No   Overview Addendum 02/11/2018  1:12 PM by Vena AustriaStaebler, Adron Geisel, MD    [facility]atal Labs  Dating LMP Blood type: --/--/A POS (03/12 1512)   Genetic Screen NIPS: Antibody:NEG (03/12 1512)  Anatomic US  Rubella: 5.35 (01/30 1515) Varicella: @VZVIGG @  GTT Early:               Third trimester:  RPR: Non Reactive (01/30 1515)   Rhogam N/A HBsAg: Negative (01/30 1515)   TDaP vaccine                       Flu Shot: HIV: Non Reactive (01/30 1515)   Baby Food                                GBS:   Contraception  Pap: 10/10/2017 NIL  CBB     CS/VBAC N/A   Support Person Husband Loistine Chancehilip            Advanced maternal age in multigravida 02/11/2018 by Vena AustriaStaebler, Edynn Gillock, MD No   Overview Signed 02/11/2018  1:11 PM by Vena AustriaStaebler, Clarine Elrod, MD    [ ]  ASA after [redacted] weeks          Gestational age appropriate obstetric precautions including but not limited to vaginal bleeding, contractions, leaking of fluid and fetal movement were reviewed in detail with the patient.    Return in about 1 week (around 02/27/2018) for ROB.  Vena AustriaAndreas Nesa Distel, MD, Evern CoreFACOG [facility], [facility] 02/21/2018, 12:27 PM

## 2018-03-02 ENCOUNTER — Ambulatory Visit (INDEPENDENT_AMBULATORY_CARE_PROVIDER_SITE_OTHER): Payer: BC Managed Care – PPO | Admitting: Obstetrics and Gynecology

## 2018-03-02 VITALS — BP 124/62 | Wt 193.0 lb

## 2018-03-02 DIAGNOSIS — Z3A08 8 weeks gestation of pregnancy: Secondary | ICD-10-CM

## 2018-03-02 DIAGNOSIS — Z23 Encounter for immunization: Secondary | ICD-10-CM

## 2018-03-02 DIAGNOSIS — O09521 Supervision of elderly multigravida, first trimester: Secondary | ICD-10-CM

## 2018-03-02 DIAGNOSIS — O09529 Supervision of elderly multigravida, unspecified trimester: Secondary | ICD-10-CM

## 2018-03-02 DIAGNOSIS — Z1509 Genetic susceptibility to other malignant neoplasm: Secondary | ICD-10-CM

## 2018-03-02 DIAGNOSIS — O0990 Supervision of high risk pregnancy, unspecified, unspecified trimester: Secondary | ICD-10-CM

## 2018-03-02 LAB — POCT URINALYSIS DIPSTICK OB
GLUCOSE, UA: NEGATIVE
POC,PROTEIN,UA: NEGATIVE

## 2018-03-02 NOTE — Progress Notes (Signed)
    Routine Prenatal Care Visit  Subjective  Jessica Cardenas is a 32 y.o. G4P1011 at [redacted]w[redacted]d being seen today for ongoing prenatal care.  She is currently monitored for the following issues for this high-risk pregnancy and has Lynch syndrome; History of cervical dysplasia; Supervision of high risk pregnancy, antepartum; and Advanced maternal age in multigravida on their problem list.  ----------------------------------------------------------------------------------- Patient reports no complaints.   Contractions: Not present. Vag. Bleeding: None.  Movement: Absent. Denies leaking of fluid.  ----------------------------------------------------------------------------------- The following portions of the patient's history were reviewed and updated as appropriate: allergies, current medications, past family history, past medical history, past social history, past surgical history and problem list. Problem list updated.   Objective  Blood pressure 124/62, weight 193 lb (87.5 kg), last menstrual period 01/04/2018. Pregravid weight 190 lb (86.2 kg) Total Weight Gain 3 lb (1.361 kg) Urinalysis:      Fetal Status: Fetal Heart Rate (bpm): 175   Movement: Absent     General:  Alert, oriented and cooperative. Patient is in no acute distress.  Skin: Skin is warm and dry. No rash noted.   Cardiovascular: Normal heart rate noted  Respiratory: Normal respiratory effort, no problems with respiration noted  Abdomen: Soft, gravid, appropriate for gestational age. Pain/Pressure: Absent     Pelvic:  Cervical exam deferred        Extremities: Normal range of motion.     ental Status: Normal mood and affect. Normal behavior. Normal judgment and thought content.     Assessment   32 y.o. G4P1011 at [redacted]w[redacted]d by  10/11/2018, by Last Menstrual Period presenting for routine prenatal visit  Plan   Pregnancy#4 Problems (from 01/04/18 to present)    Problem Noted Resolved   Supervision of high risk pregnancy,  antepartum 02/11/2018 by Vena Austria, MD No   Overview Addendum 02/11/2018  1:12 PM by Vena Austria, MD    [facility]atal [facility]  Dating LMP Blood type: --/--/A POS (03/12 1512)   Genetic Screen NIPS: Antibody:NEG (03/12 1512)  Anatomic [place]  Rubella: 5.35 (01/30 1515) Varicella: @VZVIGG @  GTT Early:               Third trimester:  RPR: Non Reactive (01/30 1515)   Rhogam N/A HBsAg: Negative (01/30 1515)   TDaP vaccine                       Flu Shot: HIV: Non Reactive (01/30 1515)   Baby Food                                GBS:   Contraception  Pap: 10/10/2017 NIL  CBB     CS/VBAC N/A   Support Person Husband Loistine Chance            Advanced maternal age in multigravida 02/11/2018 by Vena Austria, MD No   Overview Signed 02/11/2018  1:11 PM by Vena Austria, MD    [ ]  ASA after [redacted] weeks          Gestational age appropriate obstetric precautions including but not limited to vaginal bleeding, contractions, leaking of fluid and fetal movement were reviewed in detail with the patient.    Return in about 1 week (around 03/09/2018) for ROB.  Vena Austria, MD, Evern Core [facility], [facility] 03/02/2018, 4:53 PM

## 2018-03-02 NOTE — Progress Notes (Signed)
ROB

## 2018-03-03 NOTE — Addendum Note (Signed)
Addended by: [place], Josemaria Brining B on: 03/03/2018 10:49 AM   Modules accepted: Orders

## 2018-03-09 ENCOUNTER — Ambulatory Visit (INDEPENDENT_AMBULATORY_CARE_PROVIDER_SITE_OTHER): Payer: BC Managed Care – PPO | Admitting: Obstetrics and Gynecology

## 2018-03-09 VITALS — BP 130/86 | Wt 195.0 lb

## 2018-03-09 DIAGNOSIS — O0990 Supervision of high risk pregnancy, unspecified, unspecified trimester: Secondary | ICD-10-CM

## 2018-03-09 DIAGNOSIS — Z3A09 9 weeks gestation of pregnancy: Secondary | ICD-10-CM

## 2018-03-09 DIAGNOSIS — O09529 Supervision of elderly multigravida, unspecified trimester: Secondary | ICD-10-CM

## 2018-03-09 DIAGNOSIS — Z3481 Encounter for supervision of other normal pregnancy, first trimester: Secondary | ICD-10-CM

## 2018-03-09 LAB — POCT URINALYSIS DIPSTICK OB
GLUCOSE, UA: NEGATIVE
PROTEIN: NEGATIVE

## 2018-03-09 NOTE — Progress Notes (Signed)
ROB

## 2018-03-09 NOTE — Progress Notes (Signed)
    Routine Prenatal Care Visit  Subjective  Jessica Cardenas is a 32 y.o. G4P1011 at 2456w3d being seen today for ongoing prenatal care.  She is currently monitored for the following issues for this high-risk pregnancy and has Lynch syndrome; History of cervical dysplasia; Supervision of high risk pregnancy, antepartum; and Advanced maternal age in multigravida on their problem list.  ----------------------------------------------------------------------------------- Patient reports no complaints.   Contractions: Not present. Vag. Bleeding: None.  Movement: Absent. Denies leaking of fluid.  ----------------------------------------------------------------------------------- The following portions of the patient's history were reviewed and updated as appropriate: allergies, current medications, past family history, past medical history, past social history, past surgical history and problem list. Problem list updated.   Objective  Blood pressure 130/86, weight 195 lb (88.5 kg), last menstrual period 01/04/2018. Pregravid weight 190 lb (86.2 kg) Total Weight Gain 5 lb (2.268 kg) Urinalysis:      Fetal Status: Fetal Heart Rate (bpm): 187   Movement: Absent     General:  Alert, oriented and cooperative. Patient is in no acute distress.  Skin: Skin is warm and dry. No rash noted.   Cardiovascular: Normal heart rate noted  Respiratory: Normal respiratory effort, no problems with respiration noted  Abdomen: Soft, gravid, appropriate for gestational age. Pain/Pressure: Absent     Pelvic:  Cervical exam deferred        Extremities: Normal range of motion.     ental Status: Normal mood and affect. Normal behavior. Normal judgment and thought content.     Assessment   32 y.o. G4P1011 at 6056w3d by  10/11/2018, by Last Menstrual Period presenting for routine prenatal visit  Plan   Pregnancy#4 Problems (from 01/04/18 to present)    Problem Noted Resolved   Supervision of high risk pregnancy,  antepartum 02/11/2018 by Vena AustriaStaebler, Deysy Schabel, MD No   Overview Addendum 02/11/2018  1:12 PM by Vena AustriaStaebler, Delma Drone, MD    [facility]  Dating LMP Blood type: --/--/A POS (03/12 1512)   Genetic Screen NIPS: Antibody:NEG (03/12 1512)  Anatomic US  Rubella: 5.35 (01/30 1515) Varicella: @VZVIGG @  GTT Early:               Third trimester:  RPR: Non Reactive (01/30 1515)   Rhogam N/A HBsAg: Negative (01/30 1515)   TDaP vaccine                       Flu Shot: HIV: Non Reactive (01/30 1515)   Baby Food                                GBS:   Contraception  Pap: 10/10/2017 NIL  CBB     CS/VBAC N/A   Support Person Husband Loistine Chancehilip            Advanced maternal age in multigravida 02/11/2018 by Vena AustriaStaebler, Vernell Back, MD No   Overview Signed 02/11/2018  1:11 PM by Vena AustriaStaebler, Jackey Housey, MD    [ ]  ASA after [redacted] weeks          Gestational age appropriate obstetric precautions including but not limited to vaginal bleeding, contractions, leaking of fluid and fetal movement were reviewed in detail with the patient.    Return in about 1 week (around 03/16/2018) for ROB.  Vena AustriaAndreas Crystin Lechtenberg, MD, Merlinda FrederickFACOG [facility], [facility]

## 2018-03-16 ENCOUNTER — Ambulatory Visit (INDEPENDENT_AMBULATORY_CARE_PROVIDER_SITE_OTHER): Payer: BC Managed Care – PPO | Admitting: Obstetrics and Gynecology

## 2018-03-16 VITALS — BP 116/66 | Wt 196.0 lb

## 2018-03-16 DIAGNOSIS — O0990 Supervision of high risk pregnancy, unspecified, unspecified trimester: Secondary | ICD-10-CM

## 2018-03-16 DIAGNOSIS — Z3A10 10 weeks gestation of pregnancy: Secondary | ICD-10-CM

## 2018-03-16 DIAGNOSIS — Z1509 Genetic susceptibility to other malignant neoplasm: Secondary | ICD-10-CM

## 2018-03-16 DIAGNOSIS — O09529 Supervision of elderly multigravida, unspecified trimester: Secondary | ICD-10-CM

## 2018-03-16 DIAGNOSIS — O09521 Supervision of elderly multigravida, first trimester: Secondary | ICD-10-CM

## 2018-03-16 LAB — POCT URINALYSIS DIPSTICK OB
Glucose, UA: NEGATIVE
PROTEIN: NEGATIVE

## 2018-03-16 NOTE — Progress Notes (Signed)
    Routine Prenatal Care Visit  Subjective  Pilar PlateStephanie Cardenas is a 32 y.o. G4P1011 at 8374w1d being seen today for ongoing prenatal care.  She is currently monitored for the following issues for this low-risk pregnancy and has Lynch syndrome; History of cervical dysplasia; Supervision of high risk pregnancy, antepartum; and Advanced maternal age in multigravida on their problem list.  ----------------------------------------------------------------------------------- Patient reports no complaints.   Contractions: Not present. Vag. Bleeding: None.  Movement: Absent. Denies leaking of fluid.  ----------------------------------------------------------------------------------- The following portions of the patient's history were reviewed and updated as appropriate: allergies, current medications, past family history, past medical history, past social history, past surgical history and problem list. Problem list updated.   Objective  Blood pressure 116/66, weight 196 lb (88.9 kg), last menstrual period 01/04/2018. Pregravid weight 190 lb (86.2 kg) Total Weight Gain 6 lb (2.722 kg) Urinalysis:      Fetal Status: Fetal Heart Rate (bpm): 180   Movement: Absent     General:  Alert, oriented and cooperative. Patient is in no acute distress.  Skin: Skin is warm and dry. No rash noted.   Cardiovascular: Normal heart rate noted  Respiratory: Normal respiratory effort, no problems with respiration noted  Abdomen: Soft, gravid, appropriate for gestational age. Pain/Pressure: Absent     Pelvic:  Cervical exam deferred        Extremities: Normal range of motion.     ental Status: Normal mood and affect. Normal behavior. Normal judgment and thought content.     Assessment   32 y.o. G4P1011 at 4774w1d by  10/11/2018, by Last Menstrual Period presenting for routine prenatal visit  Plan   Pregnancy#4 Problems (from 01/04/18 to present)    Problem Noted Resolved   Supervision of high risk pregnancy,  antepartum 02/11/2018 by Vena AustriaStaebler, Tykesha Konicki, MD No   Overview Addendum 02/11/2018  1:12 PM by Vena AustriaStaebler, Lakisa Lotz, MD    [facility]  Dating LMP Blood type: --/--/A POS (03/12 1512)   Genetic Screen NIPS: Antibody:NEG (03/12 1512)  Anatomic US  Rubella: 5.35 (01/30 1515) Varicella: @VZVIGG @  GTT Early:               Third trimester:  RPR: Non Reactive (01/30 1515)   Rhogam N/A HBsAg: Negative (01/30 1515)   TDaP vaccine                       Flu Shot: 03/02/18 HIV: Non Reactive (01/30 1515)   Baby Food                                GBS:   Contraception  Pap: 10/10/2017 NIL  CBB     CS/VBAC N/A   Support Person Husband Loistine Chancehilip            Advanced maternal age in multigravida 02/11/2018 by Vena AustriaStaebler, Netty Sullivant, MD No   Overview Signed 02/11/2018  1:11 PM by Vena AustriaStaebler, Valentine Kuechle, MD    [ ]  ASA after [redacted] weeks          Gestational age appropriate obstetric precautions including but not limited to vaginal bleeding, contractions, leaking of fluid and fetal movement were reviewed in detail with the patient.    Return in about 1 week (around 03/23/2018) for ROB Conrad Zajkowski.  Vena AustriaAndreas Elvis Boot, MD, Merlinda FrederickFACOG [facility], [facility] 03/16/2018, 4:29 PM

## 2018-03-16 NOTE — Progress Notes (Signed)
ROB Nausea 

## 2018-03-26 ENCOUNTER — Ambulatory Visit (INDEPENDENT_AMBULATORY_CARE_PROVIDER_SITE_OTHER): Payer: BC Managed Care – PPO | Admitting: Obstetrics and Gynecology

## 2018-03-26 VITALS — BP 128/72 | Wt 198.0 lb

## 2018-03-26 DIAGNOSIS — Z1379 Encounter for other screening for genetic and chromosomal anomalies: Secondary | ICD-10-CM

## 2018-03-26 DIAGNOSIS — Z31438 Encounter for other genetic testing of female for procreative management: Secondary | ICD-10-CM

## 2018-03-26 DIAGNOSIS — O09521 Supervision of elderly multigravida, first trimester: Secondary | ICD-10-CM

## 2018-03-26 DIAGNOSIS — O09529 Supervision of elderly multigravida, unspecified trimester: Secondary | ICD-10-CM

## 2018-03-26 DIAGNOSIS — Z3A11 11 weeks gestation of pregnancy: Secondary | ICD-10-CM

## 2018-03-26 DIAGNOSIS — O0990 Supervision of high risk pregnancy, unspecified, unspecified trimester: Secondary | ICD-10-CM

## 2018-03-26 NOTE — Progress Notes (Signed)
ROB

## 2018-03-31 ENCOUNTER — Other Ambulatory Visit: Payer: Self-pay

## 2018-03-31 MED ORDER — DOXYLAMINE-PYRIDOXINE 10-10 MG PO TBEC: DELAYED_RELEASE_TABLET | ORAL | 1 refills | Status: DC

## 2018-04-02 ENCOUNTER — Ambulatory Visit (INDEPENDENT_AMBULATORY_CARE_PROVIDER_SITE_OTHER): Payer: BC Managed Care – PPO | Admitting: Obstetrics and Gynecology

## 2018-04-02 VITALS — BP 112/66 | Wt 200.0 lb

## 2018-04-02 DIAGNOSIS — Z3A12 12 weeks gestation of pregnancy: Secondary | ICD-10-CM

## 2018-04-02 LAB — POCT URINALYSIS DIPSTICK OB
Glucose, UA: NEGATIVE
PROTEIN: NEGATIVE

## 2018-04-02 LAB — MATERNIT 21 PLUS CORE, BLOOD
CHROMOSOME 13: NEGATIVE
CHROMOSOME 18: NEGATIVE
CHROMOSOME 21: NEGATIVE
Y CHROMOSOME: NOT DETECTED

## 2018-04-02 NOTE — Progress Notes (Signed)
ROB

## 2018-04-07 LAB — INHERITEST CORE(CF97,SMA,FRAX)

## 2018-04-08 ENCOUNTER — Ambulatory Visit (INDEPENDENT_AMBULATORY_CARE_PROVIDER_SITE_OTHER): Payer: BC Managed Care – PPO | Admitting: Obstetrics and Gynecology

## 2018-04-08 VITALS — BP 124/74 | Wt 198.0 lb

## 2018-04-08 DIAGNOSIS — O0990 Supervision of high risk pregnancy, unspecified, unspecified trimester: Secondary | ICD-10-CM

## 2018-04-08 DIAGNOSIS — O09529 Supervision of elderly multigravida, unspecified trimester: Secondary | ICD-10-CM

## 2018-04-08 DIAGNOSIS — Z3A13 13 weeks gestation of pregnancy: Secondary | ICD-10-CM

## 2018-04-08 DIAGNOSIS — O09521 Supervision of elderly multigravida, first trimester: Secondary | ICD-10-CM

## 2018-04-08 LAB — POCT URINALYSIS DIPSTICK OB
Glucose, UA: NEGATIVE
PROTEIN: NEGATIVE

## 2018-04-08 NOTE — Progress Notes (Signed)
ROB

## 2018-04-08 NOTE — Progress Notes (Signed)
    Routine Prenatal Care Visit  Subjective  Jessica Cardenas is a 32 y.o. G4P1011 at [redacted]w[redacted]d being seen today for ongoing prenatal care.  She is currently monitored for the following issues for this low-risk pregnancy and has Lynch syndrome; History of cervical dysplasia; Supervision of high risk pregnancy, antepartum; and Advanced maternal age in multigravida on their problem list.  ----------------------------------------------------------------------------------- Patient reports no complaints.   Contractions: Not present. Vag. Bleeding: None.  Movement: Absent. Denies leaking of fluid.  ----------------------------------------------------------------------------------- The following portions of the patient's history were reviewed and updated as appropriate: allergies, current medications, past family history, past medical history, past social history, past surgical history and problem list. Problem list updated.   Objective  Blood pressure 124/74, weight 198 lb (89.8 kg), last menstrual period 01/04/2018. Pregravid weight 190 lb (86.2 kg) Total Weight Gain 8 lb (3.629 kg) Urinalysis:      Fetal Status: Fetal Heart Rate (bpm): 160   Movement: Absent     General:  Alert, oriented and cooperative. Patient is in no acute distress.  Skin: Skin is warm and dry. No rash noted.   Cardiovascular: Normal heart rate noted  Respiratory: Normal respiratory effort, no problems with respiration noted  Abdomen: Soft, gravid, appropriate for gestational age. Pain/Pressure: Absent     Pelvic:  Cervical exam deferred        Extremities: Normal range of motion.     ental Status: Normal mood and affect. Normal behavior. Normal judgment and thought content.     Assessment   32 y.o. G4P1011 at [redacted]w[redacted]d by  10/11/2018, by Last Menstrual Period presenting for routine prenatal visit  Plan   Pregnancy#4 Problems (from 01/04/18 to present)    Problem Noted Resolved   Supervision of high risk pregnancy,  antepartum 02/11/2018 by Vena Austria, MD No   Overview Addendum 04/06/2018  3:59 PM by Vena Austria, MD    [facility]  Dating LMP Blood type: --/--/A POS (03/12 1512)   Genetic Screen NIPS:Normal XX Antibody:NEG (03/12 1512)  Anatomic [place]  Rubella: 5.35 (01/30 1515) Varicella: @VZVIGG @  GTT Early:               Third trimester:  RPR: Non Reactive (01/30 1515)   Rhogam N/A HBsAg: Negative (01/30 1515)   TDaP vaccine                       Flu Shot: 03/02/18 HIV: Non Reactive (01/30 1515)   Baby Food                                GBS:   Contraception  Pap: 10/10/2017 NIL  CBB     CS/VBAC N/A   Support Person Husband Loistine Chance            Advanced maternal age in multigravida 02/11/2018 by Vena Austria, MD No   Overview Signed 02/11/2018  1:11 PM by Vena Austria, MD    [X]  ASA after [redacted] weeks          Gestational age appropriate obstetric precautions including but not limited to vaginal bleeding, contractions, leaking of fluid and fetal movement were reviewed in detail with the patient.    Return in about 1 week (around 04/15/2018) for ROB.  Vena Austria, MD, Evern Core [facility], [facility] 04/08/2018, 12:03 PM

## 2018-04-22 ENCOUNTER — Ambulatory Visit (INDEPENDENT_AMBULATORY_CARE_PROVIDER_SITE_OTHER): Payer: BC Managed Care – PPO | Admitting: Obstetrics and Gynecology

## 2018-04-22 VITALS — BP 118/76 | Wt 201.0 lb

## 2018-04-22 DIAGNOSIS — Z363 Encounter for antenatal screening for malformations: Secondary | ICD-10-CM

## 2018-04-22 DIAGNOSIS — O09522 Supervision of elderly multigravida, second trimester: Secondary | ICD-10-CM

## 2018-04-22 DIAGNOSIS — Z87410 Personal history of cervical dysplasia: Secondary | ICD-10-CM

## 2018-04-22 DIAGNOSIS — O0990 Supervision of high risk pregnancy, unspecified, unspecified trimester: Secondary | ICD-10-CM

## 2018-04-22 DIAGNOSIS — O09529 Supervision of elderly multigravida, unspecified trimester: Secondary | ICD-10-CM

## 2018-04-22 DIAGNOSIS — Z3A15 15 weeks gestation of pregnancy: Secondary | ICD-10-CM

## 2018-04-22 LAB — POCT URINALYSIS DIPSTICK OB
GLUCOSE, UA: NEGATIVE
POC,PROTEIN,UA: NEGATIVE

## 2018-04-22 NOTE — Progress Notes (Signed)
    Routine Prenatal Care Visit  Subjective  Jessica Cardenas is a 32 y.o. G4P1011 at [redacted]w[redacted]d being seen today for ongoing prenatal care.  She is currently monitored for the following issues for this high-risk pregnancy and has Lynch syndrome; History of cervical dysplasia; Supervision of high risk pregnancy, antepartum; and Advanced maternal age in multigravida on their problem list.  ----------------------------------------------------------------------------------- Patient reports no complaints.   Contractions: Not present. Vag. Bleeding: None.  Movement: Present. Denies leaking of fluid.  ----------------------------------------------------------------------------------- The following portions of the patient's history were reviewed and updated as appropriate: allergies, current medications, past family history, past medical history, past social history, past surgical history and problem list. Problem list updated.   Objective  Blood pressure 118/76, weight 201 lb (91.2 kg), last menstrual period 01/04/2018. Pregravid weight 190 lb (86.2 kg) Total Weight Gain 11 lb (4.99 kg) Urinalysis:      Fetal Status: Fetal Heart Rate (bpm): 140   Movement: Present     General:  Alert, oriented and cooperative. Patient is in no acute distress.  Skin: Skin is warm and dry. No rash noted.   Cardiovascular: Normal heart rate noted  Respiratory: Normal respiratory effort, no problems with respiration noted  Abdomen: Soft, gravid, appropriate for gestational age. Pain/Pressure: Absent     Pelvic:  Cervical exam deferred        Extremities: Normal range of motion.     ental Status: Normal mood and affect. Normal behavior. Normal judgment and thought content.     Assessment   32 y.o. G4P1011 at [redacted]w[redacted]d by  10/11/2018, by Last Menstrual Period presenting for routine prenatal visit  Plan   Pregnancy#4 Problems (from 01/04/18 to present)    Problem Noted Resolved   Supervision of high risk pregnancy,  antepartum 02/11/2018 by Vena Austria, MD No   Overview Addendum 04/08/2018 12:26 PM by Vena Austria, MD    [facility]  Dating LMP Blood type: --/--/A POS (03/12 1512)   Genetic Screen NIPS:Normal XX Inheritest: Negative CF, SMA, Fragile-X Antibody:NEG (03/12 1512)  Anatomic [place]  Rubella: 5.35 (01/30 1515) Varicella: @VZVIGG @  GTT Early:               Third trimester:  RPR: Non Reactive (01/30 1515)   Rhogam N/A HBsAg: Negative (01/30 1515)   TDaP vaccine                       Flu Shot: 03/02/18 HIV: Non Reactive (01/30 1515)   Baby Food                                GBS:   Contraception  Pap: 10/10/2017 NIL  CBB     CS/VBAC N/A   Support Person Husband Loistine Chance            Advanced maternal age in multigravida 02/11/2018 by Vena Austria, MD No   Overview Addendum 04/22/2018  3:41 PM by Vena Austria, MD    [X]  ASA after [redacted] weeks          Gestational age appropriate obstetric precautions including but not limited to vaginal bleeding, contractions, leaking of fluid and fetal movement were reviewed in detail with the patient.    Return in about 1 week (around 04/29/2018) for ROB, 3 week anatomy scan.  Vena Austria, MD, Evern Core [facility], [facility] 04/22/2018, 3:43 PM

## 2018-04-22 NOTE — Progress Notes (Signed)
ROB Tingling feelings in vagina

## 2018-04-30 ENCOUNTER — Ambulatory Visit (INDEPENDENT_AMBULATORY_CARE_PROVIDER_SITE_OTHER): Payer: BC Managed Care – PPO | Admitting: Obstetrics and Gynecology

## 2018-04-30 VITALS — BP 114/72 | Wt 202.0 lb

## 2018-04-30 DIAGNOSIS — Z3A16 16 weeks gestation of pregnancy: Secondary | ICD-10-CM

## 2018-04-30 DIAGNOSIS — O09529 Supervision of elderly multigravida, unspecified trimester: Secondary | ICD-10-CM

## 2018-04-30 DIAGNOSIS — O0990 Supervision of high risk pregnancy, unspecified, unspecified trimester: Secondary | ICD-10-CM

## 2018-04-30 LAB — POCT URINALYSIS DIPSTICK OB
Glucose, UA: NEGATIVE
POC,PROTEIN,UA: NEGATIVE

## 2018-04-30 MED ORDER — OMEPRAZOLE 20 MG PO CPDR: 20.0000 mg | DELAYED_RELEASE_CAPSULE | Freq: Every day | ORAL | 11 refills | Status: DC

## 2018-04-30 NOTE — Progress Notes (Signed)
ROB Nausea 

## 2018-04-30 NOTE — Progress Notes (Signed)
Routine Prenatal Care Visit  Subjective  Jessica Cardenas is a 32 y.o. G4P1011 at [redacted]w[redacted]d being seen today for ongoing prenatal care.  She is currently monitored for the following issues for this low-risk pregnancy and has Lynch syndrome; History of cervical dysplasia; Supervision of high risk pregnancy, antepartum; and Advanced maternal age in multigravida on their problem list.  ----------------------------------------------------------------------------------- Patient reports no complaints.  Worsening GERD Contractions: Not present. Vag. Bleeding: None.  Movement: Present. Denies leaking of fluid.  ----------------------------------------------------------------------------------- The following portions of the patient's history were reviewed and updated as appropriate: allergies, current medications, past family history, past medical history, past social history, past surgical history and problem list. Problem list updated.   Objective  Blood pressure 114/72, weight 202 lb (91.6 kg), last menstrual period 01/04/2018. Pregravid weight 190 lb (86.2 kg) Total Weight Gain 12 lb (5.443 kg) Urinalysis:      Fetal Status: Fetal Heart Rate (bpm): 140   Movement: Present     General:  Alert, oriented and cooperative. Patient is in no acute distress.  Skin: Skin is warm and dry. No rash noted.   Cardiovascular: Normal heart rate noted  Respiratory: Normal respiratory effort, no problems with respiration noted  Abdomen: Soft, gravid, appropriate for gestational age. Pain/Pressure: Absent     Pelvic:  Cervical exam deferred        Extremities: Normal range of motion.     ental Status: Normal mood and affect. Normal behavior. Normal judgment and thought content.     Assessment   32 y.o. G4P1011 at [redacted]w[redacted]d by  10/11/2018, by Last Menstrual Period presenting for routine prenatal visit  Plan   Pregnancy#4 Problems (from 01/04/18 to present)    Problem Noted Resolved   Supervision of high  risk pregnancy, antepartum 02/11/2018 by Vena Austria, MD No   Overview Addendum 04/08/2018 12:26 PM by Vena Austria, MD    [facility]  Dating LMP Blood type: --/--/A POS (03/12 1512)   Genetic Screen NIPS:Normal XX Inheritest: Negative CF, SMA, Fragile-X Antibody:NEG (03/12 1512)  Anatomic [place]  Rubella: 5.35 (01/30 1515) Varicella: @VZVIGG @  GTT Early:               Third trimester:  RPR: Non Reactive (01/30 1515)   Rhogam N/A HBsAg: Negative (01/30 1515)   TDaP vaccine                       Flu Shot: 03/02/18 HIV: Non Reactive (01/30 1515)   Baby Food                                GBS:   Contraception  Pap: 10/10/2017 NIL  CBB     CS/VBAC N/A   Support Person Husband Loistine Chance            Advanced maternal age in multigravida 02/11/2018 by Vena Austria, MD No   Overview Addendum 04/22/2018  3:41 PM by Vena Austria, MD    [X]  ASA after [redacted] weeks          Gestational age appropriate obstetric precautions including but not limited to vaginal bleeding, contractions, leaking of fluid and fetal movement were reviewed in detail with the patient.    - Omeprazole Rx for GERD  Return in about 1 week (around 05/07/2018) for ROB - Marque Bango.  Vena Austria, MD, Evern Core [facility], [facility] 04/30/2018, 3:18 PM

## 2018-05-08 ENCOUNTER — Encounter: Payer: BC Managed Care – PPO | Admitting: Obstetrics and Gynecology

## 2018-05-13 ENCOUNTER — Ambulatory Visit (INDEPENDENT_AMBULATORY_CARE_PROVIDER_SITE_OTHER): Payer: BC Managed Care – PPO | Admitting: Obstetrics and Gynecology

## 2018-05-13 ENCOUNTER — Ambulatory Visit (INDEPENDENT_AMBULATORY_CARE_PROVIDER_SITE_OTHER): Payer: BC Managed Care – PPO

## 2018-05-13 VITALS — BP 120/80 | Wt 202.0 lb

## 2018-05-13 DIAGNOSIS — O0990 Supervision of high risk pregnancy, unspecified, unspecified trimester: Secondary | ICD-10-CM

## 2018-05-13 DIAGNOSIS — O09529 Supervision of elderly multigravida, unspecified trimester: Secondary | ICD-10-CM

## 2018-05-13 DIAGNOSIS — O0992 Supervision of high risk pregnancy, unspecified, second trimester: Secondary | ICD-10-CM

## 2018-05-13 DIAGNOSIS — Z363 Encounter for antenatal screening for malformations: Secondary | ICD-10-CM

## 2018-05-13 DIAGNOSIS — Z3A18 18 weeks gestation of pregnancy: Secondary | ICD-10-CM

## 2018-05-13 LAB — POCT URINALYSIS DIPSTICK OB
GLUCOSE, UA: NEGATIVE
POC,PROTEIN,UA: NEGATIVE

## 2018-05-13 MED ORDER — PROCHLORPERAZINE MALEATE 10 MG PO TABS: 10.0000 mg | ORAL_TABLET | Freq: Four times a day (QID) | ORAL | 3 refills | Status: DC | PRN

## 2018-05-13 NOTE — Progress Notes (Signed)
ROB/Anatomy  C/o still having nausea, and headaches

## 2018-05-14 NOTE — Progress Notes (Signed)
Routine Prenatal Care Visit  Subjective  Jessica Cardenas is a 32 y.o. G4P1011 at [redacted]w[redacted]d being seen today for ongoing prenatal care.  She is currently monitored for the following issues for this low-risk pregnancy and has Lynch syndrome; History of cervical dysplasia; Supervision of high risk pregnancy, antepartum; and Advanced maternal age in multigravida on their problem list.  ----------------------------------------------------------------------------------- Patient reports no complaints.   Contractions: Not present. Vag. Bleeding: None.  Movement: Present. Denies leaking of fluid.  ----------------------------------------------------------------------------------- The following portions of the patient's history were reviewed and updated as appropriate: allergies, current medications, past family history, past medical history, past social history, past surgical history and problem list. Problem list updated.   Objective  Blood pressure 120/80, weight 202 lb (91.6 kg), last menstrual period 01/04/2018. Pregravid weight 190 lb (86.2 kg) Total Weight Gain 12 lb (5.443 kg) Urinalysis:      Fetal Status: Fetal Heart Rate (bpm): 145   Movement: Present     General:  Alert, oriented and cooperative. Patient is in no acute distress.  Skin: Skin is warm and dry. No rash noted.   Cardiovascular: Normal heart rate noted  Respiratory: Normal respiratory effort, no problems with respiration noted  Abdomen: Soft, gravid, appropriate for gestational age. Pain/Pressure: Absent     Pelvic:  Cervical exam deferred        Extremities: Normal range of motion.     ental Status: Normal mood and affect. Normal behavior. Normal judgment and thought content.   US Ob Comp + 14 Wk  Result Date: 05/14/2018 Patient Name: Jessica Cardenas DOB: Jan 16, 1986 MRN: [id number] ULTRASOUND REPORT Location: [facility] Date of Service: 05/13/2018 Indications:Anatomy Ultrasound Findings: [name] intrauterine  pregnancy is visualized with FHR at 148 BPM. Biometrics give an (U/S) Gestational age of [redacted]w[redacted]d and an (U/S) EDD of 10/13/2018; this correlates with the clinically established Estimated Date of Delivery: 10/11/18 Fetal presentation is Cephalic. EFW: 226 (8oz). Placenta: posterior, fundal. Grade: 0 AFI: subjectively normal. Anatomic survey is complete and normal; Gender - female.  Right Ovary not seen. Left Ovary is normal appearance. Survey of the adnexa demonstrates no adnexal masses. There is no free peritoneal fluid in the cul de sac. Impression: 1. [redacted]w[redacted]d Viable Singleton Intrauterine pregnancy by U/S. 2. (U/S) EDD is consistent with Clinically established Estimated Date of Delivery: 10/11/18 . 3. Bilateral choroid plexus cysts, otherwise normal ultrasound. Recommendations: 1.Clinical correlation with the patient's History and Physical Exam. Darlina Guys, RDMS RVT  There is a singleton gestation with subjectively normal amniotic fluid volume. The fetal biometry correlates with established dating. Detailed evaluation of the fetal anatomy was performed.The fetal anatomical survey appears within normal limits within the resolution of ultrasound as described above.  It must be noted that a normal ultrasound is unable to rule out fetal aneuploidy.  "In women who screen negative for trisomy 64 (either first- or second-trimester screening) and in whom no other fetal structural abnormalities are visualized on a detailed ultrasound, the finding of an isolated choroid plexus cyst does not require additional genetic testing.  More than 90% of choroid plexus cysts resolve, most often by 28 weeks. Studies evaluating neurodevelopmental outcomes in euploid children born after a prenatal diagnosis of choroid plexus cysts have not shown differences in neurocognitive ability, motor function, or behavior. Therefore, neither serial antenatal ultrasounds nor post-natal evaluation are clinically useful." [facility] Isolated Choroid Plexus  Cyst Vena Austria, MD, [facility], [facility]     Assessment   32 y.o.  [id number] at 6496w4d by  10/11/2018, by Last Menstrual Period presenting for routine prenatal visit  Plan   Pregnancy#4 Problems (from 01/04/18 to present)    Problem Noted Resolved   Supervision of high risk pregnancy, antepartum 02/11/2018 by Vena AustriaStaebler, Cherice Glennie, MD No   Overview Addendum 04/08/2018 12:26 PM by Vena AustriaStaebler, Latese Dufault, MD    Clinic [facility] Prenatal Labs  Dating LMP Blood type: --/--/A POS (03/12 1512)   Genetic Screen NIPS:Normal XX Inheritest: Negative CF, SMA, Fragile-X Antibody:NEG (03/12 1512)  Anatomic US  Rubella: 5.35 (01/30 1515) Varicella: @VZVIGG @  GTT Early:               Third trimester:  RPR: Non Reactive (01/30 1515)   Rhogam N/A HBsAg: Negative (01/30 1515)   TDaP vaccine                       Flu Shot: 03/02/18 HIV: Non Reactive (01/30 1515)   Baby Food                                GBS:   Contraception  Pap: 10/10/2017 NIL  CBB     CS/VBAC N/A   Support Person Husband Loistine Chancehilip            Advanced maternal age in multigravida 02/11/2018 by Vena AustriaStaebler, Janiel Derhammer, MD No   Overview Addendum 04/22/2018  3:41 PM by Vena AustriaStaebler, Marquez Ceesay, MD    [X]  ASA after [redacted] weeks          Gestational age appropriate obstetric precautions including but not limited to vaginal bleeding, contractions, leaking of fluid and fetal movement were reviewed in detail with the patient.   - normal anatomy scan  Return in about 4 weeks (around 06/10/2018) for ROB.  Vena AustriaAndreas Izaiyah Kleinman, MD, [facility] FrederickFACOG [facility], [facility]

## 2018-06-11 ENCOUNTER — Ambulatory Visit (INDEPENDENT_AMBULATORY_CARE_PROVIDER_SITE_OTHER): Payer: BC Managed Care – PPO | Admitting: Obstetrics and Gynecology

## 2018-06-11 VITALS — BP 123/67 | Wt 207.0 lb

## 2018-06-11 DIAGNOSIS — Z3A23 23 weeks gestation of pregnancy: Secondary | ICD-10-CM

## 2018-06-11 DIAGNOSIS — O0990 Supervision of high risk pregnancy, unspecified, unspecified trimester: Secondary | ICD-10-CM

## 2018-06-11 DIAGNOSIS — O0992 Supervision of high risk pregnancy, unspecified, second trimester: Secondary | ICD-10-CM

## 2018-06-11 DIAGNOSIS — Z3A22 22 weeks gestation of pregnancy: Secondary | ICD-10-CM

## 2018-06-11 LAB — POCT URINALYSIS DIPSTICK OB
Glucose, UA: NEGATIVE
PROTEIN: NEGATIVE

## 2018-06-11 NOTE — Progress Notes (Signed)
ROB

## 2018-06-16 NOTE — Progress Notes (Signed)
    Routine Prenatal Care Visit  Subjective  Jessica Cardenas is a 32 y.o. G4P1011 at 7518w2d being seen today for ongoing prenatal care.  She is currently monitored for the following issues for this low-risk pregnancy and has Lynch syndrome; History of cervical dysplasia; Supervision of high risk pregnancy, antepartum; and Advanced maternal age in multigravida on their problem list.  ----------------------------------------------------------------------------------- Patient reports no complaints.   Contractions: Not present. Vag. Bleeding: None.  Movement: Present. Denies leaking of fluid.  ----------------------------------------------------------------------------------- The following portions of the patient's history were reviewed and updated as appropriate: allergies, current medications, past family history, past medical history, past social history, past surgical history and problem list. Problem list updated.   Objective  Blood pressure 123/67, weight 207 lb (93.9 kg), last menstrual period 01/04/2018. Pregravid weight 190 lb (86.2 kg) Total Weight Gain 17 lb (7.711 kg) Urinalysis:      Fetal Status: Fetal Heart Rate (bpm): 140   Movement: Present     General:  Alert, oriented and cooperative. Patient is in no acute distress.  Skin: Skin is warm and dry. No rash noted.   Cardiovascular: Normal heart rate noted  Respiratory: Normal respiratory effort, no problems with respiration noted  Abdomen: Soft, gravid, appropriate for gestational age. Pain/Pressure: Absent     Pelvic:  Cervical exam deferred        Extremities: Normal range of motion.     ental Status: Normal mood and affect. Normal behavior. Normal judgment and thought content.   No results found.   Assessment   32 y.o. G4P1011 at 9818w2d by  10/11/2018, by Last Menstrual Period presenting for routine prenatal visit  Plan   Pregnancy#4 Problems (from 01/04/18 to present)    Problem Noted Resolved   Supervision of  high risk pregnancy, antepartum 02/11/2018 by Vena AustriaStaebler, Christphor Groft, MD No   Overview Addendum 06/11/2018  4:17 PM by Vena AustriaStaebler, Samier Jaco, MD    Clinic [facility] Prenatal Labs  Dating LMP Blood type: --/--/A POS (03/12 1512)   Genetic Screen NIPS:Normal XX Inheritest: Negative CF, SMA, Fragile-X Antibody:NEG (03/12 1512)  Anatomic US Normal female Rubella: 5.35 (01/30 1515) Varicella: @VZVIGG @  GTT Early:               Third trimester:  RPR: Non Reactive (01/30 1515)   Rhogam N/A HBsAg: Negative (01/30 1515)   TDaP vaccine                       Flu Shot: 03/02/18 HIV: Non Reactive (01/30 1515)   Baby Food                                GBS:   Contraception  Pap: 10/10/2017 NIL  CBB     CS/VBAC N/A   Support Person Husband Loistine Chancehilip            Advanced maternal age in multigravida 02/11/2018 by Vena AustriaStaebler, Piper Hassebrock, MD No   Overview Addendum 04/22/2018  3:41 PM by Vena AustriaStaebler, Novah Goza, MD    [X]  ASA after [redacted] weeks          Gestational age appropriate obstetric precautions including but not limited to vaginal bleeding, contractions, leaking of fluid and fetal movement were reviewed in detail with the patient.    Return in about 4 weeks (around 07/09/2018) for ROB.  Vena AustriaAndreas Noble Bodie, MD, [facility] FrederickFACOG [facility], [facility]

## 2018-06-24 NOTE — L&D Delivery Note (Signed)
Delivery Note Primary OB: [facility] Delivery Provider: Marcelyn Bruins, CNM Gestational Age: Full term Antepartum complications: None Intrapartum complications: None  A viable Female was delivered via vertex presentation at 1709.  No nuchal cord was present. Delivery of the shoulders and body followed without difficulty. The infant was placed on the maternal abdomen. The umbilical cord was doubly clamped and cut following delayed cord clamping. The placenta was delivered spontaneously and was inspected and found to be intact with a three vessel cord. The cervix and vagina were inspected.  A 2nd degree perineal laceration was repaired with 2-0 and 3-0 Vicryl. The fundus was firm. Patient and infant were bonding in stable condition. All counts were correct.  Apgars: 8, 9  Weight:  6 lb, 12 oz.    Anesthesia:  epidural Episiotomy:  none Lacerations:  2nd Suture Repair: 2.0, 3.0 vicryl  QBL: 498 mL  Mom to postpartum.  Baby to Couplet care / Skin to Skin.  Marcelyn Bruins, CNM [facility], [facility] 10/05/2018

## 2018-06-30 ENCOUNTER — Ambulatory Visit (INDEPENDENT_AMBULATORY_CARE_PROVIDER_SITE_OTHER): Payer: BC Managed Care – PPO | Admitting: Maternal Newborn

## 2018-06-30 ENCOUNTER — Encounter: Payer: Self-pay | Admitting: Maternal Newborn

## 2018-06-30 VITALS — BP 130/80 | Wt 208.0 lb

## 2018-06-30 DIAGNOSIS — O0990 Supervision of high risk pregnancy, unspecified, unspecified trimester: Secondary | ICD-10-CM

## 2018-06-30 DIAGNOSIS — Z3A25 25 weeks gestation of pregnancy: Secondary | ICD-10-CM

## 2018-06-30 DIAGNOSIS — O368120 Decreased fetal movements, second trimester, not applicable or unspecified: Secondary | ICD-10-CM

## 2018-06-30 NOTE — Progress Notes (Signed)
    Prenatal Care Visit  Subjective  Jessica Cardenas is a 33 y.o. G4P1011 at [redacted]w[redacted]d being seen today for ongoing prenatal care.  She is currently monitored for the following issues for this high-risk pregnancy and has Lynch syndrome; History of cervical dysplasia; Supervision of high risk pregnancy, antepartum; and Advanced maternal age in multigravida on their problem list.  ----------------------------------------------------------------------------------- Patient reports decreased fetal movement since yesterday evening. Baby is usually active, and she has only felt what she described as "a few gas bubbles" today after attempts to stimulate movement. Contractions: Not present. Vag. Bleeding: None.  Movement: (!) Decreased. No leaking of fluid.  ----------------------------------------------------------------------------------- The following portions of the patient's history were reviewed and updated as appropriate: allergies, current medications, past family history, past medical history, past social history, past surgical history and problem list. Problem list updated.  Objective  Blood pressure 130/80, weight 208 lb (94.3 kg), last menstrual period 01/04/2018. Pregravid weight 190 lb (86.2 kg) Total Weight Gain 18 lb (8.165 kg)  Fetal Status: Fetal Heart Rate (bpm): 145   Movement: (!) Decreased     Assessment   33 y.o. G4P1011 at [redacted]w[redacted]d, EDD 10/11/2018 by Last Menstrual Period presenting for a work-in prenatal visit.  Plan   Pregnancy#4 Problems (from 01/04/18 to present)    Problem Noted Resolved   Supervision of high risk pregnancy, antepartum 02/11/2018 by Vena Austria, MD No   Overview Addendum 06/11/2018  4:17 PM by Vena Austria, MD    Clinic [facility] Prenatal Labs  Dating LMP Blood type: --/--/A POS (03/12 1512)   Genetic Screen NIPS:Normal XX Inheritest: Negative CF, SMA, Fragile-X Antibody:NEG (03/12 1512)  Anatomic [place] Normal female Rubella: 5.35 (01/30 1515)  Varicella: @VZVIGG @  GTT Early:               Third trimester:  RPR: Non Reactive (01/30 1515)   Rhogam N/A HBsAg: Negative (01/30 1515)   TDaP vaccine                       Flu Shot: 03/02/18 HIV: Non Reactive (01/30 1515)   Baby Food                                GBS:   Contraception  Pap: 10/10/2017 NIL  CBB     CS/VBAC N/A   Support Person Husband Loistine Chance            Advanced maternal age in multigravida 02/11/2018 by Vena Austria, MD No   Overview Addendum 04/22/2018  3:41 PM by Vena Austria, MD    [X]  ASA after 12 weeks       Easily able to find fetal heart tones at 145 bpm and auscultated FHR via Doppler ranging from 138-152. Fetal movement appreciated multiple times by mom and me, and baby was changing positions during the exam.  We discussed range of normal fetal movement patterns and I advised her to go to triage for further monitoring if she still feels that baby's movements are decreased today after this visit.  Keep scheduled ROB on 1/16 with Dr. Bonney Aid.  Marcelyn Bruins, CNM 06/30/2018  2:27 PM

## 2018-07-09 ENCOUNTER — Ambulatory Visit (INDEPENDENT_AMBULATORY_CARE_PROVIDER_SITE_OTHER): Payer: BC Managed Care – PPO | Admitting: Obstetrics and Gynecology

## 2018-07-09 VITALS — BP 126/70 | Wt 211.0 lb

## 2018-07-09 DIAGNOSIS — Z1509 Genetic susceptibility to other malignant neoplasm: Secondary | ICD-10-CM

## 2018-07-09 DIAGNOSIS — O09529 Supervision of elderly multigravida, unspecified trimester: Secondary | ICD-10-CM

## 2018-07-09 DIAGNOSIS — Z87410 Personal history of cervical dysplasia: Secondary | ICD-10-CM

## 2018-07-09 DIAGNOSIS — Z3A26 26 weeks gestation of pregnancy: Secondary | ICD-10-CM

## 2018-07-09 DIAGNOSIS — O09522 Supervision of elderly multigravida, second trimester: Secondary | ICD-10-CM

## 2018-07-09 DIAGNOSIS — O0990 Supervision of high risk pregnancy, unspecified, unspecified trimester: Secondary | ICD-10-CM

## 2018-07-09 LAB — POCT URINALYSIS DIPSTICK OB
Glucose, UA: NEGATIVE
PROTEIN: NEGATIVE

## 2018-07-09 NOTE — Progress Notes (Signed)
ROB

## 2018-07-09 NOTE — Progress Notes (Signed)
Routine Prenatal Care Visit  Subjective  Jessica Cardenas is a 33 y.o. G4P1011 at [redacted]w[redacted]d being seen today for ongoing prenatal care.  She is currently monitored for the following issues for this low-risk pregnancy and has Lynch syndrome; History of cervical dysplasia; Supervision of high risk pregnancy, antepartum; and Advanced maternal age in multigravida on their problem list.  ----------------------------------------------------------------------------------- Patient reports no complaints.   Contractions: Not present. Vag. Bleeding: None.  Movement: Present. Denies leaking of fluid.  ----------------------------------------------------------------------------------- The following portions of the patient's history were reviewed and updated as appropriate: allergies, current medications, past family history, past medical history, past social history, past surgical history and problem list. Problem list updated.   Objective  Blood pressure 126/70, weight 211 lb (95.7 kg), last menstrual period 01/04/2018. Pregravid weight 190 lb (86.2 kg) Total Weight Gain 21 lb (9.526 kg)  Body mass index is 36.22 kg/m.  Urinalysis:      Fetal Status: Fetal Heart Rate (bpm): 140   Movement: Present     General:  Alert, oriented and cooperative. Patient is in no acute distress.  Skin: Skin is warm and dry. No rash noted.   Cardiovascular: Normal heart rate noted  Respiratory: Normal respiratory effort, no problems with respiration noted  Abdomen: Soft, gravid, appropriate for gestational age. Pain/Pressure: Absent     Pelvic:  Cervical exam deferred        Extremities: Normal range of motion.     ental Status: Normal mood and affect. Normal behavior. Normal judgment and thought content.     Assessment   33 y.o. G4P1011 at [redacted]w[redacted]d by  10/11/2018, by Last Menstrual Period presenting for routine prenatal visit  Plan   Pregnancy#4 Problems (from 01/04/18 to present)    Problem Noted Resolved   Supervision of high risk pregnancy, antepartum 02/11/2018 by Vena Austria, MD No   Overview Addendum 06/11/2018  4:17 PM by Vena Austria, MD    Clinic [facility] Prenatal Labs  Dating LMP Blood type: --/--/A POS (03/12 1512)   Genetic Screen NIPS:Normal XX Inheritest: Negative CF, SMA, Fragile-X Antibody:NEG (03/12 1512)  Anatomic [place] Normal female Rubella: 5.35 (01/30 1515) Varicella: @VZVIGG @  GTT Early:               Third trimester:  RPR: Non Reactive (01/30 1515)   Rhogam N/A HBsAg: Negative (01/30 1515)   TDaP vaccine                       Flu Shot: 03/02/18 HIV: Non Reactive (01/30 1515)   Baby Food                                GBS:   Contraception  Pap: 10/10/2017 NIL  CBB     CS/VBAC N/A   Support Person Husband Loistine Chance            Advanced maternal age in multigravida 02/11/2018 by Vena Austria, MD No   Overview Addendum 04/22/2018  3:41 PM by Vena Austria, MD    [X]  ASA after [redacted] weeks          Gestational age appropriate obstetric precautions including but not limited to vaginal bleeding, contractions, leaking of fluid and fetal movement were reviewed in detail with the patient.    Return in about 2 weeks (around 07/23/2018) for ROB and 28-week labs ().  Vena Austria, MD, [facility], [facility] 07/10/2018,  9:43 PM

## 2018-07-24 ENCOUNTER — Other Ambulatory Visit: Payer: BC Managed Care – PPO

## 2018-07-24 ENCOUNTER — Ambulatory Visit (INDEPENDENT_AMBULATORY_CARE_PROVIDER_SITE_OTHER): Payer: BC Managed Care – PPO | Admitting: Obstetrics and Gynecology

## 2018-07-24 VITALS — BP 112/60 | Wt 214.0 lb

## 2018-07-24 DIAGNOSIS — Z3A28 28 weeks gestation of pregnancy: Secondary | ICD-10-CM

## 2018-07-24 DIAGNOSIS — O0993 Supervision of high risk pregnancy, unspecified, third trimester: Secondary | ICD-10-CM

## 2018-07-24 DIAGNOSIS — O09529 Supervision of elderly multigravida, unspecified trimester: Secondary | ICD-10-CM

## 2018-07-24 DIAGNOSIS — O0990 Supervision of high risk pregnancy, unspecified, unspecified trimester: Secondary | ICD-10-CM

## 2018-07-24 DIAGNOSIS — Z87410 Personal history of cervical dysplasia: Secondary | ICD-10-CM

## 2018-07-24 LAB — POCT URINALYSIS DIPSTICK OB
Glucose, UA: NEGATIVE
PROTEIN: NEGATIVE

## 2018-07-24 NOTE — Progress Notes (Signed)
ROB 28 week labs 

## 2018-07-24 NOTE — Progress Notes (Signed)
Routine Prenatal Care Visit  Subjective  Jessica Cardenas is a 33 y.o. G4P1011 at [redacted]w[redacted]d being seen today for ongoing prenatal care.  She is currently monitored for the following issues for this high-risk pregnancy and has Lynch syndrome; History of cervical dysplasia; Supervision of high risk pregnancy, antepartum; and Advanced maternal age in multigravida on their problem list.  ----------------------------------------------------------------------------------- Patient reports no complaints.   Contractions: Not present. Vag. Bleeding: None.  Movement: Present. Denies leaking of fluid.  ----------------------------------------------------------------------------------- The following portions of the patient's history were reviewed and updated as appropriate: allergies, current medications, past family history, past medical history, past social history, past surgical history and problem list. Problem list updated.   Objective  Blood pressure 112/60, weight 214 lb (97.1 kg), last menstrual period 01/04/2018. Pregravid weight 190 lb (86.2 kg) Total Weight Gain 24 lb (10.9 kg)  Body mass index is 36.73 kg/m.  Urinalysis:      Fetal Status: Fetal Heart Rate (bpm): 135   Movement: Present     General:  Alert, oriented and cooperative. Patient is in no acute distress.  Skin: Skin is warm and dry. No rash noted.   Cardiovascular: Normal heart rate noted  Respiratory: Normal respiratory effort, no problems with respiration noted  Abdomen: Soft, gravid, appropriate for gestational age. Pain/Pressure: Absent     Pelvic:  Cervical exam deferred        Extremities: Normal range of motion.     ental Status: Normal mood and affect. Normal behavior. Normal judgment and thought content.     Assessment   33 y.o. G4P1011 at [redacted]w[redacted]d by  10/11/2018, by Last Menstrual Period presenting for routine prenatal visit  Plan   Pregnancy#4 Problems (from 01/04/18 to present)    Problem Noted Resolved   Supervision of high risk pregnancy, antepartum 02/11/2018 by Vena Austria, MD No   Overview Addendum 06/11/2018  4:17 PM by Vena Austria, MD    Clinic [facility] Prenatal Labs  Dating LMP Blood type: --/--/A POS (03/12 1512)   Genetic Screen NIPS:Normal XX Inheritest: Negative CF, SMA, Fragile-X Antibody:NEG (03/12 1512)  Anatomic [place] Normal female Rubella: 5.35 (01/30 1515) Varicella: @VZVIGG @  GTT Early:               Third trimester:  RPR: Non Reactive (01/30 1515)   Rhogam N/A HBsAg: Negative (01/30 1515)   TDaP vaccine                       Flu Shot: 03/02/18 HIV: Non Reactive (01/30 1515)   Baby Food                                GBS:   Contraception  Pap: 10/10/2017 NIL  CBB     CS/VBAC N/A   Support Person Husband Loistine Chance            Advanced maternal age in multigravida 02/11/2018 by Vena Austria, MD No   Overview Addendum 04/22/2018  3:41 PM by Vena Austria, MD    [X]  ASA after [redacted] weeks          Gestational age appropriate obstetric precautions including but not limited to vaginal bleeding, contractions, leaking of fluid and fetal movement were reviewed in detail with the patient.   -28 week labs today  Return in about 2 weeks (around 08/07/2018) for ROB.  Vena Austria, MD, [facility], [facility] 07/24/2018,  3:47 PM

## 2018-07-25 LAB — 28 WEEK RH+PANEL
Basophils Absolute: 0 10*3/uL (ref 0.0–0.2)
Basos: 0 %
EOS (ABSOLUTE): 0.2 10*3/uL (ref 0.0–0.4)
Eos: 2 %
Gestational Diabetes Screen: 95 mg/dL (ref 65–139)
HIV Screen 4th Generation wRfx: NONREACTIVE
Hematocrit: 31.5 % — ABNORMAL LOW (ref 34.0–46.6)
Hemoglobin: 10.8 g/dL — ABNORMAL LOW (ref 11.1–15.9)
Immature Grans (Abs): 0.1 10*3/uL (ref 0.0–0.1)
Immature Granulocytes: 1 %
Lymphocytes Absolute: 2.2 10*3/uL (ref 0.7–3.1)
Lymphs: 17 %
MCH: 28.8 pg (ref 26.6–33.0)
MCHC: 34.3 g/dL (ref 31.5–35.7)
MCV: 84 fL (ref 79–97)
Monocytes Absolute: 0.8 10*3/uL (ref 0.1–0.9)
Monocytes: 6 %
Neutrophils Absolute: 9.8 10*3/uL — ABNORMAL HIGH (ref 1.4–7.0)
Neutrophils: 74 %
Platelets: 240 10*3/uL (ref 150–450)
RBC: 3.75 x10E6/uL — ABNORMAL LOW (ref 3.77–5.28)
RDW: 12.3 % (ref 11.7–15.4)
RPR Ser Ql: NONREACTIVE
WBC: 13.1 10*3/uL — ABNORMAL HIGH (ref 3.4–10.8)

## 2018-08-06 ENCOUNTER — Ambulatory Visit (INDEPENDENT_AMBULATORY_CARE_PROVIDER_SITE_OTHER): Payer: BC Managed Care – PPO | Admitting: Obstetrics and Gynecology

## 2018-08-06 VITALS — BP 118/60 | Wt 216.0 lb

## 2018-08-06 DIAGNOSIS — Z1509 Genetic susceptibility to other malignant neoplasm: Secondary | ICD-10-CM

## 2018-08-06 DIAGNOSIS — O09523 Supervision of elderly multigravida, third trimester: Secondary | ICD-10-CM

## 2018-08-06 DIAGNOSIS — Z23 Encounter for immunization: Secondary | ICD-10-CM

## 2018-08-06 DIAGNOSIS — O09529 Supervision of elderly multigravida, unspecified trimester: Secondary | ICD-10-CM

## 2018-08-06 DIAGNOSIS — Z3A30 30 weeks gestation of pregnancy: Secondary | ICD-10-CM

## 2018-08-06 DIAGNOSIS — O0990 Supervision of high risk pregnancy, unspecified, unspecified trimester: Secondary | ICD-10-CM

## 2018-08-06 LAB — POCT URINALYSIS DIPSTICK OB
Glucose, UA: NEGATIVE
POC,PROTEIN,UA: NEGATIVE

## 2018-08-06 MED ORDER — OMEPRAZOLE 20 MG PO CPDR: 20.0000 mg | DELAYED_RELEASE_CAPSULE | Freq: Every day | ORAL | 11 refills | Status: DC

## 2018-08-06 MED ORDER — DOXYLAMINE-PYRIDOXINE 10-10 MG PO TBEC: DELAYED_RELEASE_TABLET | ORAL | 1 refills | Status: DC

## 2018-08-06 NOTE — Progress Notes (Signed)
ROB TDAP Deberah Pelton

## 2018-08-06 NOTE — Progress Notes (Signed)
Routine Prenatal Care Visit  Subjective  Jessica Cardenas is a 33 y.o. G4P1011 at 5974w4d being seen today for ongoing prenatal care.  She is currently monitored for the following issues for this high-risk pregnancy and has Lynch syndrome; History of cervical dysplasia; Supervision of high risk pregnancy, antepartum; and Advanced maternal age in multigravida on their problem list.  ----------------------------------------------------------------------------------- Patient reports no complaints.  Has started to noted some contractions irregular without patterns Contractions: Irregular. Vag. Bleeding: None.  Movement: Present. Denies leaking of fluid.  ----------------------------------------------------------------------------------- The following portions of the patient's history were reviewed and updated as appropriate: allergies, current medications, past family history, past medical history, past social history, past surgical history and problem list. Problem list updated.   Objective  Blood pressure 118/60, weight 216 lb (98 kg), last menstrual period 01/04/2018. Pregravid weight 190 lb (86.2 kg) Total Weight Gain 26 lb (11.8 kg)  Body mass index is 37.08 kg/m.  Urinalysis:      Fetal Status: Fetal Heart Rate (bpm): 150 Fundal Height: 30 cm Movement: Present     General:  Alert, oriented and cooperative. Patient is in no acute distress.  Skin: Skin is warm and dry. No rash noted.   Cardiovascular: Normal heart rate noted  Respiratory: Normal respiratory effort, no problems with respiration noted  Abdomen: Soft, gravid, appropriate for gestational age. Pain/Pressure: Absent     Pelvic:  Cervical exam deferred        Extremities: Normal range of motion.     ental Status: Normal mood and affect. Normal behavior. Normal judgment and thought content.     Assessment   33 y.o. G4P1011 at 6874w4d by  10/11/2018, by Last Menstrual Period presenting for routine prenatal visit  Plan    Pregnancy#4 Problems (from 01/04/18 to present)    Problem Noted Resolved   Supervision of high risk pregnancy, antepartum 02/11/2018 by Vena AustriaStaebler, Arcenia Scarbro, MD No   Overview Addendum 06/11/2018  4:17 PM by Vena AustriaStaebler, Tamala Manzer, MD    Clinic [facility] Prenatal Labs  Dating LMP Blood type: --/--/A POS (03/12 1512)   Genetic Screen NIPS:Normal XX Inheritest: Negative CF, SMA, Fragile-X Antibody:NEG (03/12 1512)  Anatomic US Normal female Rubella: 5.35 (01/30 1515) Varicella: Immune  GTT 95 RPR: Non Reactive (01/30 1515)   Rhogam N/A HBsAg: Negative (01/30 1515)   TDaP vaccine                       Flu Shot: 03/02/18 HIV: Non Reactive (01/30 1515)   Baby Food                                GBS:   Contraception  Pap: 10/10/2017 NIL  CBB     CS/VBAC N/A   Support Person Husband Loistine Chancehilip            Advanced maternal age in multigravida 02/11/2018 by Vena AustriaStaebler, Charlese Gruetzmacher, MD No   Overview Addendum 04/22/2018  3:41 PM by Vena AustriaStaebler, Breeanna Galgano, MD    [X]  ASA after [redacted] weeks          Gestational age appropriate obstetric precautions including but not limited to vaginal bleeding, contractions, leaking of fluid and fetal movement were reviewed in detail with the patient.    - refill omeprazole and diclegis - given sample of fusion plus   Return in about 2 weeks (around 08/20/2018) for ROB.  Vena AustriaAndreas Domanick Cuccia, MD, [facility] FrederickFACOG [facility], [facility] 08/06/2018, 3:55 PM

## 2018-08-20 ENCOUNTER — Encounter: Payer: BC Managed Care – PPO | Admitting: Obstetrics and Gynecology

## 2018-08-25 ENCOUNTER — Telehealth: Payer: Self-pay

## 2018-08-25 NOTE — Telephone Encounter (Signed)
Pt calling c/o backache, left side pelvic pain.  [phone]  Explained diff between B-H ctx and true ctxs.  Pt states it's B-H ctxs.  Adv they do not change the cervix.  Doesn't hurt to void.  Adv e.s. tylenol two q4-6hrs while awake and heat to area out of each hour.  If doesn't get better to call to be seen.

## 2018-08-26 ENCOUNTER — Ambulatory Visit (INDEPENDENT_AMBULATORY_CARE_PROVIDER_SITE_OTHER): Payer: BC Managed Care – PPO | Admitting: Obstetrics and Gynecology

## 2018-08-26 VITALS — BP 119/70 | Wt 220.0 lb

## 2018-08-26 DIAGNOSIS — O0990 Supervision of high risk pregnancy, unspecified, unspecified trimester: Secondary | ICD-10-CM

## 2018-08-26 DIAGNOSIS — Z87410 Personal history of cervical dysplasia: Secondary | ICD-10-CM

## 2018-08-26 DIAGNOSIS — Z3A33 33 weeks gestation of pregnancy: Secondary | ICD-10-CM

## 2018-08-26 DIAGNOSIS — O09529 Supervision of elderly multigravida, unspecified trimester: Secondary | ICD-10-CM

## 2018-08-26 DIAGNOSIS — Z1509 Genetic susceptibility to other malignant neoplasm: Secondary | ICD-10-CM

## 2018-08-26 DIAGNOSIS — O09523 Supervision of elderly multigravida, third trimester: Secondary | ICD-10-CM

## 2018-08-26 LAB — POCT URINALYSIS DIPSTICK OB
Glucose, UA: NEGATIVE
POC,PROTEIN,UA: NEGATIVE

## 2018-08-26 NOTE — Progress Notes (Signed)
Routine Prenatal Care Visit  Subjective  Jessica Cardenas is a 33 y.o. G4P1011 at [redacted]w[redacted]d being seen today for ongoing prenatal care.  She is currently monitored for the following issues for this low-risk pregnancy and has Lynch syndrome; History of cervical dysplasia; Supervision of high risk pregnancy, antepartum; and Advanced maternal age in multigravida on their problem list.  ----------------------------------------------------------------------------------- Patient reports no complaints.  Some worsening lumbago Contractions: Irregular. Vag. Bleeding: None.  Movement: Present. Denies leaking of fluid.  ----------------------------------------------------------------------------------- The following portions of the patient's history were reviewed and updated as appropriate: allergies, current medications, past family history, past medical history, past social history, past surgical history and problem list. Problem list updated.   Objective  Blood pressure 119/70, weight 220 lb (99.8 kg), last menstrual period 01/04/2018. Pregravid weight 190 lb (86.2 kg) Total Weight Gain 30 lb (13.6 kg) Urinalysis:      Fetal Status: Fetal Heart Rate (bpm): 145 Fundal Height: 33 cm Movement: Present  Presentation: Vertex  General:  Alert, oriented and cooperative. Patient is in no acute distress.  Skin: Skin is warm and dry. No rash noted.   Cardiovascular: Normal heart rate noted  Respiratory: Normal respiratory effort, no problems with respiration noted  Abdomen: Soft, gravid, appropriate for gestational age. Pain/Pressure: Absent     Pelvic:  Cervical exam deferred        Extremities: Normal range of motion.     ental Status: Normal mood and affect. Normal behavior. Normal judgment and thought content.     Assessment   33 y.o. G4P1011 at [redacted]w[redacted]d by  10/11/2018, by Last Menstrual Period presenting for routine prenatal visit  Plan   Pregnancy#4 Problems (from 01/04/18 to present)    Problem Noted Resolved   Supervision of high risk pregnancy, antepartum 02/11/2018 by Vena Austria, MD No   Overview Addendum 08/06/2018  3:57 PM by Vena Austria, MD    [facility] Prenatal Labs  Dating LMP Blood type: --/--/A POS (03/12 1512)   Genetic Screen NIPS:Normal XX Inheritest: Negative CF, SMA, Fragile-X Antibody:NEG (03/12 1512)  Anatomic [place] Normal female Rubella: 5.35 (01/30 1515) Varicella: @VZVIGG @  GTT 95 RPR: Non Reactive (01/30 1515)   Rhogam N/A HBsAg: Negative (01/30 1515)   TDaP vaccine                       Flu Shot: 03/02/18 HIV: Non Reactive (01/30 1515)   Baby Food                                GBS:   Contraception  Pap: 10/10/2017 NIL  CBB     CS/VBAC N/A   Support Person Husband Loistine Chance            Advanced maternal age in multigravida 02/11/2018 by Vena Austria, MD No   Overview Addendum 04/22/2018  3:41 PM by Vena Austria, MD    [X]  ASA after [redacted] weeks          Gestational age appropriate obstetric precautions including but not limited to vaginal bleeding, contractions, leaking of fluid and fetal movement were reviewed in detail with the patient.    1) Lumbago - discussed conservative management (Tylenol, localized heat, baths, topical lineament cream).  If fails to respond to conservative measures discussed trial of flexeril   Return in about 2 weeks (around 09/09/2018) for ROB.  Vena Austria, MD, [facility], [facility]  08/26/2018, 3:10 PM

## 2018-08-26 NOTE — Progress Notes (Signed)
Jessica Cardenas pains in back

## 2018-09-09 ENCOUNTER — Other Ambulatory Visit: Payer: Self-pay

## 2018-09-09 ENCOUNTER — Ambulatory Visit (INDEPENDENT_AMBULATORY_CARE_PROVIDER_SITE_OTHER): Payer: BC Managed Care – PPO | Admitting: Obstetrics and Gynecology

## 2018-09-09 VITALS — BP 124/68 | Wt 219.0 lb

## 2018-09-09 DIAGNOSIS — O0990 Supervision of high risk pregnancy, unspecified, unspecified trimester: Secondary | ICD-10-CM

## 2018-09-09 DIAGNOSIS — Z3A35 35 weeks gestation of pregnancy: Secondary | ICD-10-CM

## 2018-09-09 DIAGNOSIS — O09529 Supervision of elderly multigravida, unspecified trimester: Secondary | ICD-10-CM

## 2018-09-09 DIAGNOSIS — O09523 Supervision of elderly multigravida, third trimester: Secondary | ICD-10-CM

## 2018-09-09 LAB — POCT URINALYSIS DIPSTICK OB
Glucose, UA: NEGATIVE
POC,PROTEIN,UA: NEGATIVE

## 2018-09-09 NOTE — Progress Notes (Signed)
    Routine Prenatal Care Visit  Subjective  Jessica Cardenas is a 33 y.o. G4P1011 at [redacted]w[redacted]d being seen today for ongoing prenatal care.  She is currently monitored for the following issues for this high-risk pregnancy and has Lynch syndrome; History of cervical dysplasia; Supervision of high risk pregnancy, antepartum; and Advanced maternal age in multigravida on their problem list.  ----------------------------------------------------------------------------------- Patient reports no complaints.   Contractions: Irregular. Vag. Bleeding: None.  Movement: Present. Denies leaking of fluid.  ----------------------------------------------------------------------------------- The following portions of the patient's history were reviewed and updated as appropriate: allergies, current medications, past family history, past medical history, past social history, past surgical history and problem list. Problem list updated.   Objective  Blood pressure 124/68, weight 219 lb (99.3 kg), last menstrual period 01/04/2018. Pregravid weight 190 lb (86.2 kg) Total Weight Gain 29 lb (13.2 kg) Urinalysis:      Fetal Status: Fetal Heart Rate (bpm): 140 Fundal Height: 35 cm Movement: Present  Presentation: Vertex  General:  Alert, oriented and cooperative. Patient is in no acute distress.  Skin: Skin is warm and dry. No rash noted.   Cardiovascular: Normal heart rate noted  Respiratory: Normal respiratory effort, no problems with respiration noted  Abdomen: Soft, gravid, appropriate for gestational age. Pain/Pressure: Absent     Pelvic:  Cervical exam deferred        Extremities: Normal range of motion.     ental Status: Normal mood and affect. Normal behavior. Normal judgment and thought content.     Assessment   33 y.o. G4P1011 at [redacted]w[redacted]d by  10/11/2018, by Last Menstrual Period presenting for routine prenatal visit  Plan   Pregnancy#4 Problems (from 01/04/18 to present)    Problem Noted Resolved   Supervision of high risk pregnancy, antepartum 02/11/2018 by Vena Austria, MD No   Overview Addendum 08/06/2018  3:57 PM by Vena Austria, MD    [facility] Prenatal Labs  Dating LMP Blood type: --/--/A POS (03/12 1512)   Genetic Screen NIPS:Normal XX Inheritest: Negative CF, SMA, Fragile-X Antibody:NEG (03/12 1512)  Anatomic [place] Normal female Rubella: 5.35 (01/30 1515) Varicella: @VZVIGG @  GTT 95 RPR: Non Reactive (01/30 1515)   Rhogam N/A HBsAg: Negative (01/30 1515)   TDaP vaccine                       Flu Shot: 03/02/18 HIV: Non Reactive (01/30 1515)   Baby Food                                GBS:   Contraception  Pap: 10/10/2017 NIL  CBB     CS/VBAC N/A   Support Person Husband Loistine Chance            Advanced maternal age in multigravida 02/11/2018 by Vena Austria, MD No   Overview Addendum 04/22/2018  3:41 PM by Vena Austria, MD    [X]  ASA after [redacted] weeks          Gestational age appropriate obstetric precautions including but not limited to vaginal bleeding, contractions, leaking of fluid and fetal movement were reviewed in detail with the patient.    Return in about 1 week (around 09/16/2018) for ROB.  Vena Austria, MD, [facility], [facility] 09/09/2018, 3:27 PM

## 2018-09-09 NOTE — Progress Notes (Signed)
ROB Discuss baby aspirin

## 2018-09-14 ENCOUNTER — Telehealth: Payer: Self-pay

## 2018-09-14 NOTE — Telephone Encounter (Signed)
Pt is 36wks; what to take for cold sxs.  [phone]  Pt has not had a high fever.  Adv for cough - plain robitussin/mussinex, Hall's cough drops; sore throat - warm salt water gargles; congestion - plain sudafed, saline nasal spray, sip hot beverages (no red raspberry tea, watch caffeine - 16oz a day allowed); aches,pain, fever - e.x. tylenol; push fluids and rest.

## 2018-09-15 ENCOUNTER — Telehealth: Payer: Self-pay

## 2018-09-15 NOTE — Telephone Encounter (Signed)
FMLA/DISABILITY form for [employer] filled out, signature obtained, and given to KT for processing. 

## 2018-09-17 ENCOUNTER — Ambulatory Visit (INDEPENDENT_AMBULATORY_CARE_PROVIDER_SITE_OTHER): Payer: BC Managed Care – PPO | Admitting: Obstetrics and Gynecology

## 2018-09-17 ENCOUNTER — Other Ambulatory Visit: Payer: Self-pay

## 2018-09-17 VITALS — BP 122/77 | Wt 220.0 lb

## 2018-09-17 DIAGNOSIS — Z880 Allergy status to penicillin: Secondary | ICD-10-CM

## 2018-09-17 DIAGNOSIS — O0990 Supervision of high risk pregnancy, unspecified, unspecified trimester: Secondary | ICD-10-CM

## 2018-09-17 DIAGNOSIS — Z3685 Encounter for antenatal screening for Streptococcus B: Secondary | ICD-10-CM

## 2018-09-17 DIAGNOSIS — Z3A36 36 weeks gestation of pregnancy: Secondary | ICD-10-CM

## 2018-09-17 DIAGNOSIS — O0993 Supervision of high risk pregnancy, unspecified, third trimester: Secondary | ICD-10-CM

## 2018-09-17 LAB — POCT URINALYSIS DIPSTICK OB
GLUCOSE, UA: NEGATIVE
POC,PROTEIN,UA: NEGATIVE

## 2018-09-17 NOTE — Progress Notes (Signed)
    Routine Prenatal Care Visit  Subjective  Jessica Cardenas is a 33 y.o. G4P1011 at [redacted]w[redacted]d being seen today for ongoing prenatal care.  She is currently monitored for the following issues for this high-risk pregnancy and has Lynch syndrome; History of cervical dysplasia; Supervision of high risk pregnancy, antepartum; and Advanced maternal age in multigravida on their problem list.  ----------------------------------------------------------------------------------- Patient reports no complaints.   Contractions: Irregular. Vag. Bleeding: None.  Movement: Present. Denies leaking of fluid.  ----------------------------------------------------------------------------------- The following portions of the patient's history were reviewed and updated as appropriate: allergies, current medications, past family history, past medical history, past social history, past surgical history and problem list. Problem list updated.   Objective  Blood pressure 122/77, weight 220 lb (99.8 kg), last menstrual period 01/04/2018. Pregravid weight 190 lb (86.2 kg) Total Weight Gain 30 lb (13.6 kg) Urinalysis:      Fetal Status: Fetal Heart Rate (bpm): 140 Fundal Height: 36 cm Movement: Present  Presentation: Vertex  General:  Alert, oriented and cooperative. Patient is in no acute distress.  Skin: Skin is warm and dry. No rash noted.   Cardiovascular: Normal heart rate noted  Respiratory: Normal respiratory effort, no problems with respiration noted  Abdomen: Soft, gravid, appropriate for gestational age. Pain/Pressure: Present     Pelvic:  Cervical exam performed Dilation: 1 Effacement (%): 40 Station: -3  Extremities: Normal range of motion.     ental Status: Normal mood and affect. Normal behavior. Normal judgment and thought content.     Assessment   33 y.o. G4P1011 at [redacted]w[redacted]d by  10/11/2018, by Last Menstrual Period presenting for routine prenatal visit  Plan   Pregnancy#4 Problems (from 01/04/18 to  present)    Problem Noted Resolved   Supervision of high risk pregnancy, antepartum 02/11/2018 by Vena Austria, MD No   Overview Addendum 08/06/2018  3:57 PM by Vena Austria, MD    [facility] Prenatal Labs  Dating LMP Blood type: --/--/A POS (03/12 1512)   Genetic Screen NIPS:Normal XX Inheritest: Negative CF, SMA, Fragile-X Antibody:NEG (03/12 1512)  Anatomic [place] Normal female Rubella: 5.35 (01/30 1515) Varicella: @VZVIGG @  GTT 95 RPR: Non Reactive (01/30 1515)   Rhogam N/A HBsAg: Negative (01/30 1515)   TDaP vaccine                       Flu Shot: 03/02/18 HIV: Non Reactive (01/30 1515)   Baby Food                                GBS:   Contraception  Pap: 10/10/2017 NIL  CBB     CS/VBAC N/A   Support Person Husband Loistine Chance            Advanced maternal age in multigravida 02/11/2018 by Vena Austria, MD No   Overview Addendum 04/22/2018  3:41 PM by Vena Austria, MD    [X]  ASA after [redacted] weeks          Gestational age appropriate obstetric precautions including but not limited to vaginal bleeding, contractions, leaking of fluid and fetal movement were reviewed in detail with the patient.    - GBS collected with sensitivities given PCN allergy  Return in about 1 week (around 09/24/2018) for ROB.  Vena Austria, MD, [facility], [facility] 09/17/2018, 3:13 PM

## 2018-09-17 NOTE — Progress Notes (Signed)
ROB GBS 

## 2018-09-21 LAB — STREP GP B CULTURE+RFLX: Strep Gp B Culture+Rflx: NEGATIVE

## 2018-09-29 ENCOUNTER — Other Ambulatory Visit: Payer: Self-pay

## 2018-09-29 ENCOUNTER — Encounter: Payer: Self-pay | Admitting: Obstetrics and Gynecology

## 2018-09-29 ENCOUNTER — Encounter: Payer: BC Managed Care – PPO | Admitting: Obstetrics and Gynecology

## 2018-09-29 ENCOUNTER — Ambulatory Visit (INDEPENDENT_AMBULATORY_CARE_PROVIDER_SITE_OTHER): Payer: BC Managed Care – PPO | Admitting: Obstetrics and Gynecology

## 2018-09-29 VITALS — BP 120/82 | Wt 219.0 lb

## 2018-09-29 DIAGNOSIS — Z3A38 38 weeks gestation of pregnancy: Secondary | ICD-10-CM

## 2018-09-29 DIAGNOSIS — O0993 Supervision of high risk pregnancy, unspecified, third trimester: Secondary | ICD-10-CM

## 2018-09-29 DIAGNOSIS — O0990 Supervision of high risk pregnancy, unspecified, unspecified trimester: Secondary | ICD-10-CM

## 2018-09-29 DIAGNOSIS — O09523 Supervision of elderly multigravida, third trimester: Secondary | ICD-10-CM

## 2018-09-29 LAB — POCT URINALYSIS DIPSTICK OB
Glucose, UA: NEGATIVE
POC,PROTEIN,UA: NEGATIVE

## 2018-09-29 NOTE — Progress Notes (Signed)
  Routine Prenatal Care Visit  Subjective  Jessica Cardenas is a 33 y.o. G4P1011 at [redacted]w[redacted]d being seen today for ongoing prenatal care.  She is currently monitored for the following issues for this high-risk pregnancy and has Lynch syndrome; History of cervical dysplasia; Supervision of high risk pregnancy, antepartum; and Advanced maternal age in multigravida on their problem list.  ----------------------------------------------------------------------------------- Patient reports no complaints.   Contractions: Irregular. Vag. Bleeding: None.  Movement: Present. Denies leaking of fluid.  ----------------------------------------------------------------------------------- The following portions of the patient's history were reviewed and updated as appropriate: allergies, current medications, past family history, past medical history, past social history, past surgical history and problem list. Problem list updated.   Objective  Blood pressure 120/82, weight 219 lb (99.3 kg), last menstrual period 01/04/2018. Pregravid weight 190 lb (86.2 kg) Total Weight Gain 29 lb (13.2 kg) Urinalysis: Urine Protein Negative  Urine Glucose Negative  Fetal Status: Fetal Heart Rate (bpm): 140 Fundal Height: 37 cm Movement: Present  Presentation: Vertex  General:  Alert, oriented and cooperative. Patient is in no acute distress.  Skin: Skin is warm and dry. No rash noted.   Cardiovascular: Normal heart rate noted  Respiratory: Normal respiratory effort, no problems with respiration noted  Abdomen: Soft, gravid, appropriate for gestational age. Pain/Pressure: Absent     Pelvic:  Cervical exam deferred        Extremities: Normal range of motion.  Edema: None  Mental Status: Normal mood and affect. Normal behavior. Normal judgment and thought content.   Assessment   33 y.o. G4P1011 at [redacted]w[redacted]d by  10/11/2018, by Last Menstrual Period presenting for routine prenatal visit  Plan   Pregnancy#4 Problems (from  01/04/18 to present)    Problem Noted Resolved   Supervision of high risk pregnancy, antepartum 02/11/2018 by Vena Austria, MD No   Overview Addendum 08/06/2018  3:57 PM by Vena Austria, MD    [facility] Prenatal Labs  Dating LMP Blood type: --/--/A POS (03/12 1512)   Genetic Screen NIPS:Normal XX Inheritest: Negative CF, SMA, Fragile-X Antibody:NEG (03/12 1512)  Anatomic [place] Normal female Rubella: 5.35 (01/30 1515) Varicella: @VZVIGG @  GTT 95 RPR: Non Reactive (01/30 1515)   Rhogam N/A HBsAg: Negative (01/30 1515)   TDaP vaccine                       Flu Shot: 03/02/18 HIV: Non Reactive (01/30 1515)   Baby Food                                GBS:   Contraception  Pap: 10/10/2017 NIL  CBB     CS/VBAC N/A   Support Person Husband Loistine Chance            Advanced maternal age in multigravida 02/11/2018 by Vena Austria, MD No   Overview Addendum 04/22/2018  3:41 PM by Vena Austria, MD    [X]  ASA after 12 weeks          Term labor symptoms and general obstetric precautions including but not limited to vaginal bleeding, contractions, leaking of fluid and fetal movement were reviewed in detail with the patient. Please refer to After Visit Summary for other counseling recommendations.   Return if symptoms worsen or fail to improve, for Keep scheduled Induction of labor on 4/12 at 0500.Marland Kitchen  Thomasene Mohair, MD, [facility], [facility] 09/29/2018 5:17 PM

## 2018-09-29 NOTE — Progress Notes (Signed)
OB History & Physical   History of Present Illness:  Chief Complaint: Induction of labor  HPI:  Jessica Cardenas is a 33 y.o. G81P1011 female at 35w2ddated by LMP.  Her pregnancy has been complicated by advanced maternal age.    She denies contractions.   She denies leakage of fluid.   She denies vaginal bleeding.   She reports fetal movement.    Maternal Medical History:   Past Medical History:  Diagnosis Date  . 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 Lynch syndrome    per [facility] chart positive for MSH2- needs annual pelvic u/s, CA 125, endometrial bx, urine cytology, colonoscopy and endoscopy  . Lynch syndrome     Past Surgical History:  Procedure Laterality Date  . COLONOSCOPY N/A 01/2014, 2017   HX of Lynch Syndrome (genetic)  . DILATION AND EVACUATION N/A 09/02/2017   Procedure: DILATATION AND EVACUATION;  Surgeon: SMalachy Mood MD;  Location: [facility] ORS;  Service: Gynecology;  Laterality: N/A;  . ENDOMETRIAL BIOPSY  2017   normal  . ESOPHAGOGASTRODUODENOSCOPY ENDOSCOPY      Allergies  Allergen Reactions  . Aloe Vera [Aloe] Rash  . Penicillins Rash    As infant Has patient had a PCN reaction causing immediate rash, facial/tongue/throat swelling, SOB or lightheadedness with hypotension: Unknown Has patient had a PCN reaction causing severe rash involving mucus membranes or skin necrosis: Unknown Has patient had a PCN reaction that required hospitalization: No Has patient had a PCN reaction occurring within the last 10 years: No If all of the above answers are "NO", then may proceed with Cephalosporin use.     Prior to Admission medications   Medication Sig Start Date End Date Taking? Authorizing Provider  aspirin 81 MG chewable tablet Chew by mouth daily.    [provider]  Doxylamine-Pyridoxine (DICLEGIS) 10-10 MG TBEC Pt takes 1 midday and 2QHS 08/06/18   SMalachy Mood MD  omeprazole (PRILOSEC) 20 MG capsule Take 1 capsule (20 mg total) by mouth daily.  08/06/18   SMalachy Mood MD  prenatal vitamin w/FE, FA (NATACHEW) 29-1 MG CHEW chewable tablet Chew 1 tablet by mouth daily at 12 noon.    [provider]    OB History  Gravida Para Term Preterm AB Living  _0 SAB TAB Ectopic Multiple Live Births  1     0 1    # Outcome Date GA Lbr Len/2nd Weight Sex Delivery Anes PTL Lv  4 Current           3 Term 06/05/15 445w0d0:23 / 01:26 7 lb 12 oz (3.515 kg) M Vag-Spont EPI  LIV  2 SAB           1 Gravida             Prenatal care site: [facility]  Social History: She  reports that she has never smoked. She has never used smokeless tobacco. She reports current alcohol use. She reports that she does not use drugs.  Family History: family history includes Breast cancer (age of onset: 4056in her maternal grandmother; Cardiomyopathy in her maternal grandmother; Colon cancer in her maternal grandfather; Ovarian cancer (age of onset: 3057in her maternal aunt; Stomach cancer (age of onset: 7575in her paternal grandmother; Uterine cancer in her maternal aunt.   Review of Systems: Negative x 10 systems reviewed except as noted in the HPI.    Physical Exam:  Vital Signs:  BP 120/82   Wt 219 lb (99.3 kg)   LMP 01/04/2018 (Exact Date)   BMI 37.59 kg/m  Constitutional: Well nourished, well developed female in no acute distress.  HEENT: normal Skin: Warm and dry.  Cardiovascular: Regular rate and rhythm.   Extremity: no edema  Respiratory: Clear to auscultation bilateral. Normal respiratory effort Abdomen: FHT present, gravid/NT Back: no CVAT Neuro: DTRs 2+, Cranial nerves grossly intact Psych: Alert and Oriented x3. No memory deficits. Normal mood and affect.  MS: normal gait, normal bilateral lower extremity ROM/strength/stability.   Pertinent Results:  Prenatal Labs: Blood type/Rh A positive  Antibody screen negative  Rubella Immune  Varicella Immune    RPR NR  HBsAg negative  HIV negative  GC negative   Chlamydia negative  Genetic screening NIPT diploid XX  1 hour GTT 95  3 hour GTT n/a  GBS negative on 09/17/2018    Assessment:  Jessica Cardenas is a 33 y.o. G51P1011 female at 32w2dwith IOL at 39 weeks.   Plan:  1. Admit to Labor & Delivery  2. CBC, T&S, Clrs, IVF 3. GBS negative.   4. Discussed IOL versus awaiting spontaneous labor. Patient strongly wants elective IOL.    SPrentice Docker MD 09/29/2018 5:20 PM

## 2018-09-29 NOTE — Progress Notes (Signed)
  [facility] INDUCTION ASSESSMENT Jessica Cardenas 10/16/1985 Medical record #: [id number] Phone #:  Home Phone [phone]  Mobile [phone]    Prenatal Provider:[facility] Proposed admission date/time:10/04/2018 at 0500 Method of induction:Cytotec  Weight: Filed Weights04/07/20 1403Weight:219 lb (99.3 kg) BMI Body mass index is 37.59 kg/m. HIV Negative HSV Negative EDC Estimated Date of Delivery: 4/19/20based on:LMP  Gestational age on admission: 39 weeks Gravidity/parity:G4P1011  Cervix Score   0 1 2 3   Position Posterior Midposition Anterior   Consistency Firm Medium Soft   Effacement (%) 0-30 40-50 60-70 >80  Dilation (cm) Closed 1-2 3-4 >5  Baby's station -3 -2 -1 +1, +2   Bishop Score:3   select indication(s) below Elective induction ?39 weeks multiparous patient   Medical Indications Adapted from ACOG Committee Opinion #[id number], "Medically Indicated Late Preterm and Early Term Deliveries," 2013.  PLACENTAL / UTERINE ISSUES FETAL ISSUES MATERNAL ISSUES  ? Placenta previa (36.0-37.6) ? Isoimmunization (37.0-38.6) ? Preeclampsia without severe features or gestational HTN (37.0)  ? Suspected accreta (34.0-35.6) ? Growth Restriction [name]) ? Preeclampsia with severe features (34.0)  ? Prior classical CD, uterine window, rupture (36.0-37.6) ? Isolated (38.0-39.6) ? Chronic HTN (38.0-39.6)  ? Prior myomectomy (37.0-38.6) ? Concurrent findings (34.0-37.6) ? Cholestasis (37.0)  ? Umbilical vein varix (37.0) ? Growth Restriction (Twins) ? Diabetes  ? Placental abruption (chronic) ? Di-Di Isolated (36.0-37.6) ? Pregestational, controlled (39.0)  OBSTETRIC ISSUES ? Di-Di concurrent findings (32.0-34.6) ? Pregestational, uncontrolled (37.0-39.0)  ? Postdates ? (41 weeks) ? Mo-Di isolated (32.0-34.6) ? Pregestational, vascular compromise (37.0- 39.0)  ? PPROM (34.0) ? Multiple Gestation ? Gestational, diet controlled  (40.0)  ? Hx of IUFD (39.0 weeks) ? Di-Di (38.0-38.6) ? Gestational, med controlled (39.0)  ? Polyhydramnios, mild/moderate; SDV 8-16 or AFI 25-35 (39.0) ? Mo-Di (36.0-37.6) ? Gestational, uncontrolled (38.0-39.0)  ? Oligohydramnios (36.0-37.6); MVP <2 cm  For indications not listed above, delivery recommendations from maternal-fetal medicine consultant occurred on: Date: n/a with Dr. Gretta Cool  for indication of:n/a  Provider Signature: Thomasene Mohair Scheduled JQ:[id number], RN Date:09/29/2018 5:18 PM   Call [phone] to finalize the induction date/time  [id number] (07/17)

## 2018-10-04 ENCOUNTER — Inpatient Hospital Stay: Payer: BC Managed Care – PPO | Admitting: Anesthesiology

## 2018-10-04 ENCOUNTER — Inpatient Hospital Stay
Admission: EM | Admit: 2018-10-04 | Discharge: 2018-10-06 | DRG: 807 | Disposition: A | Discharge status: Home / Self Care | Payer: BC Managed Care – PPO | Attending: Obstetrics and Gynecology | Admitting: Obstetrics and Gynecology

## 2018-10-04 ENCOUNTER — Other Ambulatory Visit: Payer: Self-pay

## 2018-10-04 DIAGNOSIS — Z3A39 39 weeks gestation of pregnancy: Secondary | ICD-10-CM

## 2018-10-04 DIAGNOSIS — O1204 Gestational edema, complicating childbirth: Principal | ICD-10-CM | POA: Diagnosis present

## 2018-10-04 DIAGNOSIS — Z880 Allergy status to penicillin: Secondary | ICD-10-CM | POA: Diagnosis not present

## 2018-10-04 DIAGNOSIS — O26893 Other specified pregnancy related conditions, third trimester: Secondary | ICD-10-CM | POA: Diagnosis present

## 2018-10-04 DIAGNOSIS — O0990 Supervision of high risk pregnancy, unspecified, unspecified trimester: Secondary | ICD-10-CM

## 2018-10-04 DIAGNOSIS — O09523 Supervision of elderly multigravida, third trimester: Secondary | ICD-10-CM

## 2018-10-04 DIAGNOSIS — Z3490 Encounter for supervision of normal pregnancy, unspecified, unspecified trimester: Secondary | ICD-10-CM | POA: Diagnosis present

## 2018-10-04 LAB — CBC
HCT: 33.7 % — ABNORMAL LOW (ref 36.0–46.0)
Hemoglobin: 11.3 g/dL — ABNORMAL LOW (ref 12.0–15.0)
MCH: 28.4 pg (ref 26.0–34.0)
MCHC: 33.5 g/dL (ref 30.0–36.0)
MCV: 84.7 fL (ref 80.0–100.0)
Platelets: 235 10*3/uL (ref 150–400)
RBC: 3.98 MIL/uL (ref 3.87–5.11)
RDW: 13.1 % (ref 11.5–15.5)
WBC: 12.5 10*3/uL — ABNORMAL HIGH (ref 4.0–10.5)
nRBC: 0 % (ref 0.0–0.2)

## 2018-10-04 LAB — TYPE AND SCREEN
ABO/RH(D): A POS
Antibody Screen: NEGATIVE

## 2018-10-04 MED ORDER — OXYTOCIN 10 UNIT/ML IJ SOLN: 10.0000 [IU] | Freq: Once | INTRAMUSCULAR | Status: DC

## 2018-10-04 MED ORDER — EPHEDRINE 5 MG/ML INJ
10.0000 mg | INTRAVENOUS | Status: DC | PRN
  Filled 2018-10-04: qty 2

## 2018-10-04 MED ORDER — FENTANYL 2.5 MCG/ML W/ROPIVACAINE 0.15% IN NS 100 ML EPIDURAL (ARMC)
EPIDURAL | Status: AC
  Filled 2018-10-04: qty 100

## 2018-10-04 MED ORDER — BUPIVACAINE HCL (PF) 0.25 % IJ SOLN
INTRAMUSCULAR | Status: DC | PRN
  Administered 2018-10-04: 5 mL via EPIDURAL
  Administered 2018-10-05: 2 mL via EPIDURAL
  Administered 2018-10-05: 4 mL via EPIDURAL

## 2018-10-04 MED ORDER — DIPHENHYDRAMINE HCL 50 MG/ML IJ SOLN: 12.5000 mg | INTRAMUSCULAR | Status: DC | PRN

## 2018-10-04 MED ORDER — SOD CITRATE-CITRIC ACID 500-334 MG/5ML PO SOLN: 30.0000 mL | ORAL | Status: DC | PRN

## 2018-10-04 MED ORDER — TERBUTALINE SULFATE 1 MG/ML IJ SOLN: 0.2500 mg | Freq: Once | INTRAMUSCULAR | Status: DC | PRN

## 2018-10-04 MED ORDER — LACTATED RINGERS IV SOLN: 500.0000 mL | INTRAVENOUS | Status: DC | PRN

## 2018-10-04 MED ORDER — AMMONIA AROMATIC IN INHA
RESPIRATORY_TRACT | Status: AC
  Filled 2018-10-04: qty 10

## 2018-10-04 MED ORDER — OXYTOCIN BOLUS FROM INFUSION
500.0000 mL | Freq: Once | INTRAVENOUS | Status: AC
  Administered 2018-10-05: 500 mL via INTRAVENOUS

## 2018-10-04 MED ORDER — LIDOCAINE HCL (PF) 1 % IJ SOLN: 30.0000 mL | INTRAMUSCULAR | Status: DC | PRN

## 2018-10-04 MED ORDER — LACTATED RINGERS IV SOLN
INTRAVENOUS | Status: DC
  Administered 2018-10-04 – 2018-10-05 (×4): via INTRAVENOUS

## 2018-10-04 MED ORDER — OXYTOCIN 10 UNIT/ML IJ SOLN
INTRAMUSCULAR | Status: AC
  Filled 2018-10-04: qty 2

## 2018-10-04 MED ORDER — FENTANYL 2.5 MCG/ML W/ROPIVACAINE 0.15% IN NS 100 ML EPIDURAL (ARMC)
EPIDURAL | Status: DC | PRN
  Administered 2018-10-04: 12 mL/h via EPIDURAL

## 2018-10-04 MED ORDER — MISOPROSTOL 200 MCG PO TABS
ORAL_TABLET | ORAL | Status: AC
  Administered 2018-10-04: 25 ug via VAGINAL
  Filled 2018-10-04: qty 4

## 2018-10-04 MED ORDER — LACTATED RINGERS IV SOLN: 500.0000 mL | Freq: Once | INTRAVENOUS | Status: DC

## 2018-10-04 MED ORDER — ONDANSETRON HCL 4 MG/2ML IJ SOLN
4.0000 mg | Freq: Four times a day (QID) | INTRAMUSCULAR | Status: DC | PRN
  Administered 2018-10-05: 4 mg via INTRAVENOUS
  Filled 2018-10-04: qty 2

## 2018-10-04 MED ORDER — PHENYLEPHRINE 40 MCG/ML (10ML) SYRINGE FOR IV PUSH (FOR BLOOD PRESSURE SUPPORT)
80.0000 ug | PREFILLED_SYRINGE | INTRAVENOUS | Status: DC | PRN
  Filled 2018-10-04: qty 10

## 2018-10-04 MED ORDER — FENTANYL 2.5 MCG/ML W/ROPIVACAINE 0.15% IN NS 100 ML EPIDURAL (ARMC)
12.0000 mL/h | EPIDURAL | Status: DC
  Administered 2018-10-05 (×2): 12 mL/h via EPIDURAL
  Filled 2018-10-04 (×2): qty 100

## 2018-10-04 MED ORDER — OXYTOCIN 40 UNITS IN NORMAL SALINE INFUSION - SIMPLE MED
1.0000 m[IU]/min | INTRAVENOUS | Status: DC
  Administered 2018-10-04: 2 m[IU]/min via INTRAVENOUS

## 2018-10-04 MED ORDER — MISOPROSTOL 100 MCG PO TABS
25.0000 ug | ORAL_TABLET | ORAL | Status: DC | PRN
  Administered 2018-10-04 – 2018-10-05 (×3): 25 ug via VAGINAL
  Filled 2018-10-04 (×4): qty 1

## 2018-10-04 MED ORDER — LIDOCAINE HCL (PF) 1 % IJ SOLN
INTRAMUSCULAR | Status: AC
  Filled 2018-10-04: qty 30

## 2018-10-04 MED ORDER — OXYTOCIN 40 UNITS IN NORMAL SALINE INFUSION - SIMPLE MED
2.5000 [IU]/h | INTRAVENOUS | Status: DC
  Administered 2018-10-05: 2.5 [IU]/h via INTRAVENOUS
  Filled 2018-10-04 (×2): qty 1000

## 2018-10-04 NOTE — Progress Notes (Signed)
Labor Check  Subj:  Complaints: comfortable   Obj:  BP 112/60   Pulse 88   Temp 97.8 F (36.6 C) (Oral)   Resp 15   Ht 5\' 4"  (1.626 m)   Wt 99.3 kg   LMP 01/04/2018 (Exact Date)   BMI 37.59 kg/m     Cervix: Dilation: 3 / Effacement (%): 40 / Station: -3  Baseline FHR: 135 beats/min   Variability: moderate   Accelerations: present   Decelerations: absent Contractions: infrequent Overall assessment: cat 1  A/P: 33 y.o. [id number] female at [redacted]w[redacted]d with IOL.  1.  Labor: start pitocin  2.  FWB: reassuring, Overall assessment: category 1  3.  GBS negative 3/30  4.  Pain: prn, epidural when ready 5.  Recheck: prn when in good pattern   Thomasene Mohair, MD, [facility], [facility] 10/04/2018 3:34 PM

## 2018-10-04 NOTE — H&P (Signed)
H&P UPDATE   Patient presents for schedule labor and delivery.  She notes +FM, no LOF, no vaginal bleeding. Occasional contractions.    The rest of her H&P is reviewed and updated as below.  Objective: Vitals:   10/04/18 0549 10/04/18 0719  BP: 122/69 110/64  Pulse: 90 74  Resp: 16 15  Temp: 97.7 F (36.5 C) 98 F (36.7 C)    GEN: NAD Genital: cervix 1 cm. Foley bulb placement accomplished with a speculum and ring forcep.  Balloon instilled with 40 mL sterile saline.   Patient tolerated procedure well.    FHR: 130 bpm, Accelerations: present, Decelerations: absent, contrations, 2 q 10 min   A/P: IOL. Continue with misoprostol/Foley bulb Fetal status: reassuring, category 1  Thomasene Mohair, MD, [facility], [facility] 10/04/2018 9:55 AM

## 2018-10-04 NOTE — Anesthesia Preprocedure Evaluation (Signed)
Anesthesia Evaluation  Patient identified by MRN, date of birth, ID band Patient awake    Reviewed: Allergy & Precautions, NPO status , Patient's Chart, lab work & pertinent test results, reviewed documented beta blocker date and time   Airway Mallampati: III  TM Distance: >3 FB     Dental  (+) Chipped   Pulmonary           Cardiovascular      Neuro/Psych    GI/Hepatic   Endo/Other    Renal/GU      Musculoskeletal   Abdominal   Peds  Hematology   Anesthesia Other Findings   Reproductive/Obstetrics                             Anesthesia Physical Anesthesia Plan  ASA: II  Anesthesia Plan: Epidural   Post-op Pain Management:    Induction:   PONV Risk Score and Plan:   Airway Management Planned:   Additional Equipment:   Intra-op Plan:   Post-operative Plan:   Informed Consent: I have reviewed the patients History and Physical, chart, labs and discussed the procedure including the risks, benefits and alternatives for the proposed anesthesia with the patient or authorized representative who has indicated his/her understanding and acceptance.       Plan Discussed with: CRNA  Anesthesia Plan Comments:         Anesthesia Quick Evaluation

## 2018-10-04 NOTE — Anesthesia Procedure Notes (Signed)
Epidural Patient location during procedure: OB  Staffing Anesthesiologist: Joane Postel, MD Performed: anesthesiologist   Preanesthetic Checklist Completed: patient identified, site marked, surgical consent, pre-op evaluation, timeout performed, IV checked, risks and benefits discussed and monitors and equipment checked  Epidural Patient position: sitting Prep: ChloraPrep Patient monitoring: heart rate, continuous pulse ox and blood pressure Approach: midline Location: L4-L5 Injection technique: LOR saline  Needle:  Needle type: Tuohy  Needle gauge: 18 G Needle length: 9 cm and 9 Catheter type: closed end flexible Catheter size: 20 Guage Test dose: negative and 1.5% lidocaine with Epi 1:200 K  Assessment Sensory level: T10 Events: blood not aspirated, injection not painful, no injection resistance, negative IV test and no paresthesia  Additional Notes   Patient tolerated the insertion well without complications.Reason for block:procedure for pain     

## 2018-10-05 DIAGNOSIS — Z3A39 39 weeks gestation of pregnancy: Secondary | ICD-10-CM

## 2018-10-05 MED ORDER — COCONUT OIL OIL
1.0000 "application " | TOPICAL_OIL | Status: DC | PRN
  Filled 2018-10-05: qty 120

## 2018-10-05 MED ORDER — WITCH HAZEL-GLYCERIN EX PADS: 1.0000 "application " | MEDICATED_PAD | CUTANEOUS | Status: DC | PRN

## 2018-10-05 MED ORDER — OXYCODONE-ACETAMINOPHEN 5-325 MG PO TABS: 1.0000 | ORAL_TABLET | ORAL | Status: DC | PRN

## 2018-10-05 MED ORDER — SENNOSIDES-DOCUSATE SODIUM 8.6-50 MG PO TABS
2.0000 | ORAL_TABLET | ORAL | Status: DC
  Administered 2018-10-06: 2 via ORAL
  Filled 2018-10-05: qty 2

## 2018-10-05 MED ORDER — DIBUCAINE (PERIANAL) 1 % EX OINT: 1.0000 "application " | TOPICAL_OINTMENT | CUTANEOUS | Status: DC | PRN

## 2018-10-05 MED ORDER — OXYCODONE-ACETAMINOPHEN 5-325 MG PO TABS: 2.0000 | ORAL_TABLET | ORAL | Status: DC | PRN

## 2018-10-05 MED ORDER — DIPHENHYDRAMINE HCL 25 MG PO CAPS: 25.0000 mg | ORAL_CAPSULE | Freq: Four times a day (QID) | ORAL | Status: DC | PRN

## 2018-10-05 MED ORDER — IBUPROFEN 600 MG PO TABS
600.0000 mg | ORAL_TABLET | Freq: Four times a day (QID) | ORAL | Status: DC
  Administered 2018-10-06 (×2): 600 mg via ORAL
  Filled 2018-10-05 (×3): qty 1

## 2018-10-05 MED ORDER — LIDOCAINE HCL (PF) 1 % IJ SOLN
INTRAMUSCULAR | Status: DC | PRN
  Administered 2018-10-05: 3 mL via SUBCUTANEOUS

## 2018-10-05 MED ORDER — ONDANSETRON HCL 4 MG PO TABS: 4.0000 mg | ORAL_TABLET | ORAL | Status: DC | PRN

## 2018-10-05 MED ORDER — ONDANSETRON HCL 4 MG/2ML IJ SOLN: 4.0000 mg | INTRAMUSCULAR | Status: DC | PRN

## 2018-10-05 MED ORDER — BENZOCAINE-MENTHOL 20-0.5 % EX AERO
1.0000 "application " | INHALATION_SPRAY | CUTANEOUS | Status: DC | PRN
  Filled 2018-10-05: qty 56

## 2018-10-05 MED ORDER — ACETAMINOPHEN 325 MG PO TABS
650.0000 mg | ORAL_TABLET | ORAL | Status: DC | PRN
  Administered 2018-10-06 (×2): 650 mg via ORAL
  Filled 2018-10-05 (×2): qty 2

## 2018-10-05 MED ORDER — LIDOCAINE-EPINEPHRINE (PF) 1.5 %-1:200000 IJ SOLN
INTRAMUSCULAR | Status: DC | PRN
  Administered 2018-10-05: 3 mL via EPIDURAL

## 2018-10-05 MED ORDER — PRENATAL MULTIVITAMIN CH
1.0000 | ORAL_TABLET | Freq: Every day | ORAL | Status: DC
  Administered 2018-10-06: 1 via ORAL
  Filled 2018-10-05: qty 1

## 2018-10-05 MED ORDER — SIMETHICONE 80 MG PO CHEW: 80.0000 mg | CHEWABLE_TABLET | ORAL | Status: DC | PRN

## 2018-10-05 NOTE — Anesthesia Procedure Notes (Signed)
Epidural Patient location during procedure: OB Start time: 10/05/2018 2:00 PM End time: 10/05/2018 2:15 PM  Staffing Anesthesiologist: Piscitello, Cleda Mccreedy, MD Resident/CRNA: Karoline Caldwell, CRNA Performed: resident/CRNA   Preanesthetic Checklist Completed: patient identified, site marked, surgical consent, pre-op evaluation, timeout performed, IV checked, risks and benefits discussed and monitors and equipment checked  Epidural Patient position: sitting Prep: ChloraPrep Patient monitoring: heart rate, continuous pulse ox and blood pressure Approach: midline Location: L3-L4 Injection technique: LOR saline  Needle:  Needle type: Tuohy  Needle gauge: 17 G Needle length: 9 cm and 9 Needle insertion depth: 6 cm Catheter type: closed end flexible Catheter size: 19 Gauge Catheter at skin depth: 11 cm Test dose: negative and 1.5% lidocaine with Epi 1:200 K  Assessment Events: blood not aspirated, injection not painful, no injection resistance, negative IV test and no paresthesia  Additional Notes 1 attempt Pt. Evaluated and documentation done after procedure finished. Patient identified. Risks/Benefits/Options discussed with patient including but not limited to bleeding, infection, nerve damage, paralysis, failed block, incomplete pain control, headache, blood pressure changes, nausea, vomiting, reactions to medication both or allergic, itching and postpartum back pain. Confirmed with bedside nurse the patient's most recent platelet count. Confirmed with patient that they are not currently taking any anticoagulation, have any bleeding history or any family history of bleeding disorders. Patient expressed understanding and wished to proceed. All questions were answered. Sterile technique was used throughout the entire procedure. Please see nursing notes for vital signs. Test dose was given through epidural catheter and negative prior to continuing to dose epidural or start infusion. Warning  signs of high block given to the patient including shortness of breath, tingling/numbness in hands, complete motor block, or any concerning symptoms with instructions to call for help. Patient was given instructions on fall risk and not to get out of bed. All questions and concerns addressed with instructions to call with any issues or inadequate analgesia.   Patient tolerated the insertion well without immediate complications.Reason for block:procedure for pain

## 2018-10-05 NOTE — Discharge Summary (Signed)
OB Discharge Summary     Patient Name: Jessica Cardenas DOB: August 11, 1985 MRN: [id number]  Date of admission: 10/04/2018 Delivering Provider: Oswaldo Conroy, CNM  Date of Delivery: 10/05/2018  Date of discharge: 10/06/2018 Admitting diagnosis: induction Intrauterine pregnancy: [redacted]w[redacted]d     Secondary diagnosis: None     Discharge diagnosis: Term Pregnancy Delivered, No other diagnosis                         Hospital course:  Induction of Labor With Vaginal Delivery   33 y.o. yo [id number] at [redacted]w[redacted]d was admitted to the hospital 10/04/2018 for induction of labor.  Indication for induction: Elective at term.  Patient had an uncomplicated labor course as follows: Membrane Rupture Time/Date: 11:40 AM ,10/05/2018   Intrapartum Procedures: Episiotomy: None [1]                                         Lacerations:  2nd degree [3];Perineal [11];Labial [10]  Patient had delivery of a Viable infant.  Information for the patient's newborn:  Mellina, Kassay [[id number]]  Delivery Method: Vag-Spont   10/05/2018  Details of delivery can be found in separate delivery note.  Patient had a routine postpartum course. Patient is discharged home 10/06/2018                                                              Post partum procedures:none  Complications: None  Physical exam on 10/06/2018: Vitals:   10/06/18 0012 10/06/18 0336 10/06/18 0808 10/06/18 1134  BP: 109/71 107/76 108/75 119/75  Pulse: 73 66 82 80  Resp: 20 18 20 20   Temp: 97.7 F (36.5 C) 97.6 F (36.4 C) 98.1 F (36.7 C) 97.9 F (36.6 C)  TempSrc: Oral Oral Axillary Oral  SpO2: 98% 100% 98%   Weight:      Height:       General: alert, cooperative and no distress Lochia: appropriate Uterine Fundus: firm Incision: Healing well with no significant drainage DVT Evaluation: No evidence of DVT seen on physical exam.  Labs: Lab Results  Component Value Date   WBC 15.8 (H) 10/06/2018   HGB 9.7 (L) 10/06/2018   HCT 30.0 (L)  10/06/2018   MCV 87.5 10/06/2018   PLT 183 10/06/2018   No flowsheet data found.  Discharge instruction: per After Visit Summary.  Medications:  Allergies as of 10/06/2018      Reactions   Aloe Vera [aloe] Rash   Penicillins Rash   As infant Has patient had a PCN reaction causing immediate rash, facial/tongue/throat swelling, SOB or lightheadedness with hypotension: Unknown Has patient had a PCN reaction causing severe rash involving mucus membranes or skin necrosis: Unknown Has patient had a PCN reaction that required hospitalization: No Has patient had a PCN reaction occurring within the last 10 years: No If all of the above answers are "NO", then may proceed with Cephalosporin use.      Medication List    STOP taking these medications   aspirin 81 MG chewable tablet   Doxylamine-Pyridoxine 10-10 MG Tbec Commonly known as:  Diclegis     TAKE these medications   acetaminophen 325  MG tablet Commonly known as:  Tylenol Take 2 tablets (650 mg total) by mouth every 4 (four) hours as needed for mild pain or moderate pain (for pain scale < 4).   ibuprofen 600 MG tablet Commonly known as:  ADVIL,MOTRIN Take 1 tablet (600 mg total) by mouth every 6 (six) hours.   omeprazole 20 MG capsule Commonly known as:  PRILOSEC Take 1 capsule (20 mg total) by mouth daily.   prenatal vitamin w/FE, FA 29-1 MG Chew chewable tablet Chew 1 tablet by mouth daily at 12 noon.       Diet: routine diet  Activity: Advance as tolerated. Pelvic rest for 6 weeks.   Outpatient follow up: Follow-up Information    Oswaldo ConroySchmid, Jacelyn Y, CNM. Schedule an appointment as soon as possible for a visit in 6 week(s).   Specialties:  Certified Nurse Midwife, Radiology Contact information: [address] [place] [address] [phone]             Postpartum contraception: Condoms Rhogam Given postpartum: no Rubella vaccine given postpartum: no Varicella vaccine given postpartum: no TDaP  given antepartum or postpartum: Yes  Newborn Data: Live born female Barbette OrGenevieve Birth Weight: 6 lb 12.3 oz (3070 g) APGAR: 8, 9  Newborn Delivery   Birth date/time:  10/05/2018 17:09:00 Delivery type:  Vaginal, Spontaneous      Baby Feeding: Breast  Disposition:home with mother  SIGNED:  Farrel ConnersColleen Eshan Trupiano, CNM 10/06/2018 6:06 PM

## 2018-10-05 NOTE — Progress Notes (Signed)
  Labor Progress Note   33 y.o. [id number] @ [redacted]w[redacted]d , admitted for  Pregnancy, Labor Management. Induction of Labor.  Subjective:  Comfortable with epidural.  Objective:  BP 110/66 (BP Location: Left Arm)   Pulse 81   Temp 98.4 F (36.9 C) (Oral)   Resp 16   Ht 5\' 4"  (1.626 m)   Wt 99.3 kg   LMP 01/04/2018 (Exact Date)   SpO2 97%   BMI 37.59 kg/m  Abd: gravid, non-tender Extr: 1+ bilateral pedal edema SVE: 4.5/60/-3, AROM for clear fluid  EFM: FHR: 125 bpm, variability: moderate,  accelerations:  Present,  decelerations:  Absent Toco: Frequency: Every 1.5-3 minutes, Duration: 60-70 seconds and Intensity: moderate Labs: I have reviewed the patient's lab results.   Assessment & Plan:  [id number] @ [redacted]w[redacted]d, admitted for  Pregnancy and Labor/Delivery Management  1. Pain management: epidural. 2. FWB: FHT category I.  3. ID: GBS negative 4. Labor management: continue to titrate Pitocin.  Marcelyn Bruins, CNM 10/05/2018

## 2018-10-06 LAB — CBC
HCT: 30 % — ABNORMAL LOW (ref 36.0–46.0)
Hemoglobin: 9.7 g/dL — ABNORMAL LOW (ref 12.0–15.0)
MCH: 28.3 pg (ref 26.0–34.0)
MCHC: 32.3 g/dL (ref 30.0–36.0)
MCV: 87.5 fL (ref 80.0–100.0)
Platelets: 183 10*3/uL (ref 150–400)
RBC: 3.43 MIL/uL — ABNORMAL LOW (ref 3.87–5.11)
RDW: 13.3 % (ref 11.5–15.5)
WBC: 15.8 10*3/uL — ABNORMAL HIGH (ref 4.0–10.5)
nRBC: 0 % (ref 0.0–0.2)

## 2018-10-06 LAB — HEPATITIS B SURFACE ANTIGEN: Hepatitis B Surface Ag: NEGATIVE

## 2018-10-06 LAB — RPR: RPR Ser Ql: NONREACTIVE

## 2018-10-06 MED ORDER — ACETAMINOPHEN 325 MG PO TABS: 650.0000 mg | ORAL_TABLET | ORAL | Status: DC | PRN

## 2018-10-06 MED ORDER — IBUPROFEN 600 MG PO TABS
600.0000 mg | ORAL_TABLET | Freq: Four times a day (QID) | ORAL | Status: DC
  Administered 2018-10-06 (×2): 600 mg via ORAL
  Filled 2018-10-06 (×2): qty 1

## 2018-10-06 MED ORDER — IBUPROFEN 600 MG PO TABS: 600.0000 mg | ORAL_TABLET | Freq: Four times a day (QID) | ORAL | 1 refills | Status: DC

## 2018-10-06 NOTE — Plan of Care (Signed)
Vs stable; up ad lib; tolerating regular diet; taking tylenol and motrin for pain control; able to SL the iv site; since labs have come back appropriate levels for post-delivery, will be able to have iv removed (per MD order); pt has her own breast pump to use

## 2018-10-06 NOTE — Progress Notes (Signed)
All discharge instructions reviewed with pt; pt knows to make follow up appointment; pt discharged at this time via wheelchair escorted by [facility]; pt going home with husband and new baby

## 2018-10-06 NOTE — Anesthesia Postprocedure Evaluation (Signed)
Anesthesia Post Note  Patient: Jessica Cardenas  Procedure(s) Performed: AN AD HOC LABOR EPIDURAL  Patient location during evaluation: Mother Baby Anesthesia Type: Epidural Level of consciousness: awake and alert Pain management: pain level controlled Vital Signs Assessment: post-procedure vital signs reviewed and stable Respiratory status: spontaneous breathing, nonlabored ventilation and respiratory function stable Cardiovascular status: stable Postop Assessment: no headache, no backache and epidural receding Anesthetic complications: no     Last Vitals:  Vitals:   10/06/18 0012 10/06/18 0336  BP: 109/71 107/76  Pulse: 73 66  Resp: 20 18  Temp: 36.5 C 36.4 C  SpO2: 98% 100%    Last Pain:  Vitals:   10/06/18 0445  TempSrc:   PainSc: 1                  Jules Schick

## 2018-10-06 NOTE — Lactation Note (Signed)
This note was copied from a baby's chart. Lactation Consultation Note  Patient Name: Jessica Cardenas [id number] Date: 10/06/2018 Reason for consult: Initial assessment   Maternal Data    Feeding Feeding Type: Formula Nipple Type: Slow - flow  LATCH Score                   Interventions    Lactation Tools Discussed/Used     Consult Status Consult Status: Complete  MOB states that she wants to exclusively pump and bottlefeed baby her expressed colostrum/milk. MOB did this for 45mos with prior baby who is now 3yo. MOB has Willow pump and says that it is working well she just needs to use coconut oil to create a good seal. LC went over pumping q2/3hrs or 8xs in a 24hr period in order to create a breastmilk supply. LC gave parents a brief educational review on establishing supply and breastmilk storage magnet. LC gave info on virtual consults and support group.   Jessica Cardenas 10/06/2018, 11:04 AM

## 2018-10-06 NOTE — Discharge Instructions (Signed)
Vaginal Delivery, Care After °Refer to this sheet in the next few weeks. These instructions provide you with information about caring for yourself after vaginal delivery. Your health care provider may also give you more specific instructions. Your treatment has been planned according to current medical practices, but problems sometimes occur. Call your health care provider if you have any problems or questions. °What can I expect after the procedure? °After vaginal delivery, it is common to have: °· Some bleeding from your vagina. °· Soreness in your abdomen, your vagina, and the area of skin between your vaginal opening and your anus (perineum). °· Pelvic cramps. °· Fatigue. °Follow these instructions at home: °Medicines °· Take over-the-counter and prescription medicines only as told by your health care provider. °· If you were prescribed an antibiotic medicine, take it as told by your health care provider. Do not stop taking the antibiotic until it is finished. °Driving ° °· Do not drive or operate heavy machinery while taking prescription pain medicine. °· Do not drive for 24 hours if you received a sedative. °Lifestyle °· Do not drink alcohol. This is especially important if you are breastfeeding or taking medicine to relieve pain. °· Do not use tobacco products, including cigarettes, chewing tobacco, or e-cigarettes. If you need help quitting, ask your health care provider. °Eating and drinking °· Drink at least 8 eight-ounce glasses of water every day unless you are told not to by your health care provider. If you choose to breastfeed your baby, you may need to drink more water than this. °· Eat high-fiber foods every day. These foods may help prevent or relieve constipation. High-fiber foods include: °? Whole grain cereals and breads. °? Brown rice. °? Beans. °? Fresh fruits and vegetables. °Activity °· Return to your normal activities as told by your health care provider. Ask your health care provider what  activities are safe for you. °· Rest as much as possible. Try to rest or take a nap when your baby is sleeping. °· Do not lift anything that is heavier than your baby or 10 lb (4.5 kg) until your health care provider says that it is safe. °· Talk with your health care provider about when you can engage in sexual activity. This may depend on your: °? Risk of infection. °? Rate of healing. °? Comfort and desire to engage in sexual activity. °Vaginal Care °· If you have an episiotomy or a vaginal tear, check the area every day for signs of infection. Check for: °? More redness, swelling, or pain. °? More fluid or blood. °? Warmth. °? Pus or a bad smell. °· Do not use tampons or douches until your health care provider says this is safe. °· Watch for any blood clots that may pass from your vagina. These may look like clumps of dark red, brown, or black discharge. °General instructions °· Keep your perineum clean and dry as told by your health care provider. °· Wear loose, comfortable clothing. °· Wipe from front to back when you use the toilet. °· Ask your health care provider if you can shower or take a bath. If you had an episiotomy or a perineal tear during labor and delivery, your health care provider may tell you not to take baths for a certain length of time. °· Wear a bra that supports your breasts and fits you well. °· If possible, have someone help you with household activities and help care for your baby for at least a few days after you   leave the hospital.  Keep all follow-up visits for you and your baby as told by your health care provider. This is important. Contact a health care provider if:  You have: ? Vaginal discharge that has a bad smell. ? Difficulty urinating. ? Pain when urinating. ? A sudden increase or decrease in the frequency of your bowel movements. ? More redness, swelling, or pain around your episiotomy or vaginal tear. ? More fluid or blood coming from your episiotomy or vaginal  tear. ? Pus or a bad smell coming from your episiotomy or vaginal tear. ? A fever. ? A rash. ? Little or no interest in activities you used to enjoy. ? Questions about caring for yourself or your baby.  Your episiotomy or vaginal tear feels warm to the touch.  Your episiotomy or vaginal tear is separating or does not appear to be healing.  Your breasts are painful, hard, or turn red.  You feel unusually sad or worried.  You feel nauseous or you vomit.  You pass large blood clots from your vagina. If you pass a blood clot from your vagina, save it to show to your health care provider. Do not flush blood clots down the toilet without having your health care provider look at them.  You urinate more than usual.  You are dizzy or light-headed.  You have not breastfed at all and you have not had a menstrual period for 12 weeks after delivery.  You have stopped breastfeeding and you have not had a menstrual period for 12 weeks after you stopped breastfeeding. Get help right away if:  You have: ? Pain that does not go away or does not get better with medicine. ? Chest pain. ? Difficulty breathing. ? Blurred vision or spots in your vision. ? Thoughts about hurting yourself or your baby.  You develop pain in your abdomen or in one of your legs.  You develop a severe headache.  You faint.  You bleed from your vagina so much that you fill two sanitary pads in one hour. This information is not intended to replace advice given to you by your health care provider. Make sure you discuss any questions you have with your health care provider. Document Released: 06/07/2000 Document Revised: 11/22/2015 Document Reviewed: 06/25/2015 Elsevier Interactive Patient Education  2019 [employer]vier Inc.  Breast Pumping Tips Breast pumping is a way to get milk out of your breasts. You will then store the milk for your baby to use when you are away from home. There are three ways to pump. You can:  Use  your hand to massage and squeeze your breast (hand expression).  Use a hand-held machine to manually pump your milk.  Use an electric machine to pump your milk. In the beginning you may not get much milk. After a few days your breasts should make more. Pumping can help you start making milk after your baby is born. Pumping helps you to keep making milk when you are away from your baby. When should I pump? You can start pumping soon after your baby is born. Follow these tips:  When you are with your baby: ? Pump after you breastfeed. ? Pump from the free breast while you breastfeed.  When you are away from your baby: ? Pump every 2-3 hours for 15 minutes. ? Pump both breasts at the same time if you can.  If your baby drinks formula, pump around the time your baby gets the formula.  If you drank alcohol,  wait 2 hours before you pump.  If you are going to have surgery, ask your doctor when you should pump again. How do I get ready to pump? Take steps to relax. Try these things to help your milk come in:  Smell your baby's blanket or clothes.  Look at a picture or video of your baby.  Sit in a quiet, private space.  Massage your breast and nipple.  Place a cloth on your breast. The cloth should be warm and a little wet.  Play relaxing music.  Picture your milk flowing. What are some tips? General tips for pumping breast milk  Always wash your hands before pumping.  If you do not get much milk or if pumping hurts, try different pump settings or a different kind of pump.  Drink enough fluid so your pee (urine) is clear or pale yellow.  Wear clothing that opens in the front or is easy to take off.  Pump milk into a clean bottle or container.  Do not use anything that has nicotine or tobacco. Examples are cigarettes and e-cigarettes. If you need help quitting, ask your doctor. Tips for storing breast milk  Store breast milk in a clean, BPA-free container. These  include: ? A glass or plastic bottle. ? A milk storage bag.  Store only 2-4 ounces of breast milk in each container.  Swirl the breast milk in the container. Do not shake it.  Write down the date you pumped the milk on the container.  This is how long you can store breast milk: ? Room temperature: 6-8 hours. It is best to use the milk within 4 hours. ? Cooler with ice packs: 24 hours. ? Refrigerator: 5-8 days, if the milk is clean. It is best to use the milk within 3 days. ? Freezer: 9-12 months, if the milk is clean and stored away from the freezer door. It is best to use the milk within 6 months.  Put milk in the back of the refrigerator or freezer.  Thaw frozen milk using warm water. Do not use the microwave. Tips for choosing a breast pump When choosing a pump, keep the following things in mind:  Manual breast pumps do not need electricity. They cost less. They can be hard to use.  Electric breast pumps use electricity. They are more expensive. They are easier to use. They collect more milk.  The suction cup (flange) should be the right size.  Before you buy the pump, check if your insurance will pay for it. Tips for caring for a breast pump  Check the manual that came with your pump for cleaning tips.  Clean the pump after you use it. To do this: 1. Wipe down the electrical part. Use a dry cloth or paper towel. Do not put this part in water or in cleaning products. 2. Wash the plastic parts with soap and warm water. Or use the dishwasher if the manual says it is safe. You do not need to clean the tubing unless it touched breast milk. 3. Let all the parts air dry. Avoid drying them with a cloth or towel. 4. When the parts are clean and dry, put the pump back together. Then store the pump.  If there is water in the tubing when you want to pump: 1. Attach the tubing to the pump. 2. Turn on the pump. 3. Turn off the pump when the tube is dry.  Try not to touch the inside  of pump parts.  Summary  Pumping can help you start making milk after your baby is born. It lets you keep making milk when you are away from your baby.  When you are away from your baby, pump for about 15 minutes every 2-3 hours. Pump both breasts at the same time, if you can. This information is not intended to replace advice given to you by your health care provider. Make sure you discuss any questions you have with your health care provider. Document Released: 11/27/2007 Document Revised: 07/15/2016 Document Reviewed: 07/15/2016 Elsevier Interactive Patient Education  2019 [employer]

## 2018-10-06 NOTE — Progress Notes (Signed)
Post Partum Day 1 Subjective: up ad lib, voiding, tolerating PO and having mild pain in perineum/ stitches. Pumping and bottle feeding. Would like to go home today if baby is discharged  Objective: Blood pressure 108/75, pulse 82, temperature 98.1 F (36.7 C), temperature source Axillary, resp. rate 20, height 5\' 4"  (1.626 m), weight 99.3 kg, last menstrual period 01/04/2018, SpO2 98 %, unknown if currently breastfeeding.  Physical Exam:  General: alert, cooperative and no distress Lochia: appropriate Uterine Fundus: firm/ at U/ ML/ NT Perineum: healing well, no significant drainage, intact DVT Evaluation: No evidence of DVT seen on physical exam. +calf and pedal edema  Recent Labs    10/04/18 0614 10/06/18 0414  HGB 11.3* 9.7*  HCT 33.7* 30.0*  WBC 12.5* 15.8*  PLT 235 183    Assessment/Plan: Stable PPD #1 A POS/ RI/ VI Hept BSAg drawn this AM (was not done this pregnancy) Breast/Bottle Contraception-condoms TDAP given 08/06/18 Discharge instructions reviewed Farrel Conners, CNM   LOS: 2 days   Farrel Conners 10/06/2018, 10:19 AM

## 2018-10-11 ENCOUNTER — Inpatient Hospital Stay: Admit: 2018-10-11 | Payer: Self-pay

## 2018-10-19 ENCOUNTER — Encounter: Payer: Self-pay | Admitting: Maternal Newborn

## 2018-10-22 ENCOUNTER — Encounter: Payer: Self-pay | Admitting: Maternal Newborn

## 2018-10-26 ENCOUNTER — Other Ambulatory Visit: Payer: Self-pay | Admitting: Obstetrics and Gynecology

## 2018-10-26 MED ORDER — SERTRALINE HCL 50 MG PO TABS: 50.0000 mg | ORAL_TABLET | Freq: Every day | ORAL | 2 refills | Status: DC

## 2018-10-26 NOTE — Telephone Encounter (Signed)
Phone visit depression check 1 week

## 2018-10-26 NOTE — Telephone Encounter (Signed)
Patient is schedule 11/04/18 °

## 2018-10-31 ENCOUNTER — Encounter: Payer: Self-pay | Admitting: Emergency Medicine

## 2018-10-31 ENCOUNTER — Emergency Department
Admission: EM | Admit: 2018-10-31 | Discharge: 2018-11-01 | Disposition: A | Discharge status: Other Acute Inpatient Hospital | Payer: BC Managed Care – PPO | Source: Home / Self Care | Attending: Emergency Medicine | Admitting: Emergency Medicine

## 2018-10-31 ENCOUNTER — Other Ambulatory Visit: Payer: Self-pay

## 2018-10-31 DIAGNOSIS — R45850 Homicidal ideations: Secondary | ICD-10-CM | POA: Insufficient documentation

## 2018-10-31 DIAGNOSIS — F329 Major depressive disorder, single episode, unspecified: Secondary | ICD-10-CM | POA: Insufficient documentation

## 2018-10-31 DIAGNOSIS — Z1159 Encounter for screening for other viral diseases: Secondary | ICD-10-CM | POA: Insufficient documentation

## 2018-10-31 DIAGNOSIS — O99345 Other mental disorders complicating the puerperium: Secondary | ICD-10-CM | POA: Insufficient documentation

## 2018-10-31 DIAGNOSIS — F53 Postpartum depression: Secondary | ICD-10-CM | POA: Insufficient documentation

## 2018-10-31 DIAGNOSIS — R45851 Suicidal ideations: Secondary | ICD-10-CM | POA: Insufficient documentation

## 2018-10-31 HISTORY — DX: Postpartum depression: F53.0

## 2018-10-31 HISTORY — DX: Other mental disorders complicating the puerperium: O99.345

## 2018-10-31 LAB — COMPREHENSIVE METABOLIC PANEL
ALT: 14 U/L (ref 0–44)
AST: 17 U/L (ref 15–41)
Albumin: 4.2 g/dL (ref 3.5–5.0)
Alkaline Phosphatase: 101 U/L (ref 38–126)
Anion gap: 8 (ref 5–15)
BUN: 11 mg/dL (ref 6–20)
CO2: 23 mmol/L (ref 22–32)
Calcium: 9 mg/dL (ref 8.9–10.3)
Chloride: 107 mmol/L (ref 98–111)
Creatinine, Ser: 0.68 mg/dL (ref 0.44–1.00)
GFR calc Af Amer: >60 mL/min (ref >60)
GFR calc non Af Amer: >60 mL/min (ref >60)
Glucose, Bld: 91 mg/dL (ref 70–99)
Potassium: 3.5 mmol/L (ref 3.5–5.1)
Sodium: 138 mmol/L (ref 135–145)
Total Bilirubin: 0.6 mg/dL (ref 0.3–1.2)
Total Protein: 7.7 g/dL (ref 6.5–8.1)

## 2018-10-31 LAB — CBC
HCT: 39.4 % (ref 36.0–46.0)
Hemoglobin: 13.1 g/dL (ref 12.0–15.0)
MCH: 28.1 pg (ref 26.0–34.0)
MCHC: 33.2 g/dL (ref 30.0–36.0)
MCV: 84.4 fL (ref 80.0–100.0)
Platelets: 245 10*3/uL (ref 150–400)
RBC: 4.67 MIL/uL (ref 3.87–5.11)
RDW: 12 % (ref 11.5–15.5)
WBC: 10.6 10*3/uL — ABNORMAL HIGH (ref 4.0–10.5)
nRBC: 0 % (ref 0.0–0.2)

## 2018-10-31 LAB — URINE DRUG SCREEN, QUALITATIVE (ARMC ONLY)
Amphetamines, Ur Screen: NOT DETECTED
Barbiturates, Ur Screen: NOT DETECTED
Benzodiazepine, Ur Scrn: NOT DETECTED
Cannabinoid 50 Ng, Ur ~~LOC~~: NOT DETECTED
Cocaine Metabolite,Ur ~~LOC~~: NOT DETECTED
MDMA (Ecstasy)Ur Screen: NOT DETECTED
Methadone Scn, Ur: NOT DETECTED
Opiate, Ur Screen: NOT DETECTED
Phencyclidine (PCP) Ur S: NOT DETECTED
Tricyclic, Ur Screen: NOT DETECTED

## 2018-10-31 LAB — ACETAMINOPHEN LEVEL: Acetaminophen (Tylenol), Serum: <10 ug/mL — ABNORMAL LOW (ref 10–30)

## 2018-10-31 LAB — SALICYLATE LEVEL: Salicylate Lvl: <7 mg/dL (ref 2.8–30.0)

## 2018-10-31 LAB — ETHANOL: Alcohol, Ethyl (B): <10 mg/dL (ref <10)

## 2018-10-31 LAB — POCT PREGNANCY, URINE: Preg Test, Ur: NEGATIVE

## 2018-10-31 NOTE — ED Notes (Signed)
Report given to [facility] Dr Orpah Clinton. Camera placed in room and pt understanding of process.

## 2018-10-31 NOTE — ED Notes (Signed)
2 bottles of breast milk labeled and placed in plastic bag and taken to 3rd floor to store until patients disposition made.

## 2018-10-31 NOTE — ED Notes (Signed)
Patient reports having SI since birth of daughter 3 weeks ago. Reports she was seen by provider and started on zoloft 1 week ago. Patient reports after taking medication for 1 week, she is now having homicidal thoughts toward her daughter. Patient tearful when talking to this RN and MD. States she feels guilty about these thoughts and has no intention on acting on them. Patient cooperative with staff and states, "I just dont want to have these thoughts anymore".

## 2018-10-31 NOTE — ED Notes (Signed)
Computer placed in room for TTS consult 

## 2018-10-31 NOTE — ED Notes (Signed)
With patients permission patients husband updated on plan of care and given her password.

## 2018-10-31 NOTE — ED Triage Notes (Signed)
Pt presents to ED via POV c/o having thoughts of wanting to harm her newborn baby. 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 Pt calm, cooperative with triage.

## 2018-10-31 NOTE — ED Notes (Signed)
Pt dressed out by this tech and RN [place] belongings include black sandals, blue top, red sweater, underwear, bra, grey shorts, fitbit watch, hair tie, 2 silver rings, 1 silver ring with white stones, 1 silver ring with green stone, face mask, phone, wallet, backpack which has a change of clothes and breast pump.

## 2018-10-31 NOTE — ED Provider Notes (Signed)
[facility] Emergency Department Provider Note       Time seen: ----------------------------------------- 6:16 PM on 10/31/2018 -----------------------------------------   I have reviewed the triage vital signs and the nursing notes.  HISTORY   Chief Complaint Homicidal and Postpartum Complications    HPI Jessica Cardenas is a 33 y.o. female with a history of postpartum depression who presents to the ED for any thoughts of wanting to hurt her newborn baby.  She is 3 weeks postpartum, states she started to have thoughts of what the baby's neck would look like if the neck was broken.  She felt incredible shame from this and has not been able to get over these feelings.  She has had thoughts that maybe she should disappear but has not had any specific suicidal plan.  She was started on Zoloft 1 week ago.  Past Medical History:  Diagnosis Date  . 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 Lynch syndrome    per [facility] chart positive for MSH2- needs annual pelvic u/s, CA 125, endometrial bx, urine cytology, colonoscopy and endoscopy  . Lynch syndrome   . Post partum depression     Patient Active Problem List   Diagnosis Date Noted  . Normal spontaneous vaginal delivery 10/06/2018  . Postpartum care following vaginal delivery 10/06/2018  . Encounter for elective induction of labor 10/04/2018  . Supervision of high risk pregnancy, antepartum 02/11/2018  . Advanced maternal age in multigravida 02/11/2018  . Lynch syndrome 05/27/2017  . History of cervical dysplasia 05/27/2017    Past Surgical History:  Procedure Laterality Date  . COLONOSCOPY N/A 01/2014, 2017   HX of Lynch Syndrome (genetic)  . DILATION AND EVACUATION N/A 09/02/2017   Procedure: DILATATION AND EVACUATION;  Surgeon: Malachy Mood, MD;  Location: [facility] ORS;  Service: Gynecology;  Laterality: N/A;  . ENDOMETRIAL BIOPSY  2017   normal  . ESOPHAGOGASTRODUODENOSCOPY ENDOSCOPY      Allergies Aloe  vera [aloe] and Penicillins  Social History Social History   Tobacco Use  . Smoking status: Never Smoker  . Smokeless tobacco: Never Used  Substance Use Topics  . Alcohol use: Yes    Comment: occasional  . Drug use: No   Review of Systems Constitutional: Negative for fever. Cardiovascular: Negative for chest pain. Respiratory: Negative for shortness of breath. Musculoskeletal: Negative for back pain. Skin: Negative for rash. Neurological: Negative for headaches, focal weakness or numbness. Psychiatric: Positive for feelings of depression, transient suicidal and homicidal thought  All systems negative/normal/unremarkable except as stated in the HPI  ____________________________________________   PHYSICAL EXAM:  VITAL SIGNS: ED Triage Vitals  Enc Vitals Group     BP 10/31/18 1747 (!) 144/91     Pulse Rate 10/31/18 1747 77     Resp 10/31/18 1747 18     Temp 10/31/18 1747 98.3 F (36.8 C)     Temp Source 10/31/18 1747 Oral     SpO2 10/31/18 1747 98 %     Weight 10/31/18 1746 195 lb (88.5 kg)     Height 10/31/18 1746 '5\' 4"'  (1.626 m)     Head Circumference --      Peak Flow --      Pain Score --      Pain Loc --      Pain Edu? --      Excl. in [place]? --    Constitutional: Alert and oriented. Well appearing and in no distress. Eyes: Conjunctivae are normal. Normal extraocular movements. Cardiovascular:  Normal rate, regular rhythm. No murmurs, rubs, or gallops. Respiratory: Normal respiratory effort without tachypnea nor retractions. Breath sounds are clear and equal bilaterally. No wheezes/rales/rhonchi. Gastrointestinal: Soft and nontender. Normal bowel sounds Musculoskeletal: Nontender with normal range of motion in extremities. No lower extremity tenderness nor edema. Neurologic:  Normal speech and language. No gross focal neurologic deficits are appreciated.  Skin:  Skin is warm, dry and intact. No rash noted. Psychiatric: Depressed and  tearful ___________________________________________  ED COURSE:  As part of my medical decision making, I reviewed the following data within the [place] History obtained from family if available, nursing notes, old chart and ekg, as well as notes from prior ED visits. Patient presented for postpartum depression, we will assess with labs and imaging as indicated at this time.   Procedures  Jessica Cardenas was evaluated in Emergency Department on 10/31/2018 for the symptoms described in the history of present illness. She was evaluated in the context of the global COVID-19 pandemic, which necessitated consideration that the patient might be at risk for infection with the SARS-CoV-2 virus that causes COVID-19. Institutional protocols and algorithms that pertain to the evaluation of patients at risk for COVID-19 are in a state of rapid change based on information released by regulatory bodies including the CDC and federal and state organizations. These policies and algorithms were followed during the patient's care in the ED.  ____________________________________________   LABS (pertinent positives/negatives)  Labs Reviewed  CBC - Abnormal; Notable for the following components:      Result Value   WBC 10.6 (*)    All other components within normal limits  COMPREHENSIVE METABOLIC PANEL  ETHANOL  SALICYLATE LEVEL  ACETAMINOPHEN LEVEL  URINE DRUG SCREEN, QUALITATIVE ([place])  POC URINE PREG, ED  POCT PREGNANCY, URINE   ____________________________________________   DIFFERENTIAL DIAGNOSIS   Postpartum depression, medication side effect, suicidal ideation  FINAL ASSESSMENT AND PLAN  Postpartum depression   Plan: The patient had presented for transient thoughts of wanting to hurt herself or her baby. Patient's labs are reassuring.  She is medically clear for psychiatric evaluation and disposition.   Laurence Aly, MD    Note: This note was generated in  part or whole with voice recognition software. Voice recognition is usually quite accurate but there are transcription errors that can and very often do occur. I apologize for any typographical errors that were not detected and corrected.     Earleen Newport, MD 10/31/18 [phone]

## 2018-10-31 NOTE — ED Notes (Signed)
Patient currently breastfeeding and pumping every 4 hours. Breast pump brought from upstairs. Patient instructed on how to use pump. Per 3rd floor, we should be able to store patient's milk in their refrigerator. They will let us know if this changes. Husband will come to pick up milk if patient is not discharged prior to morning.

## 2018-11-01 ENCOUNTER — Inpatient Hospital Stay
Admission: AD | Admit: 2018-11-01 | Discharge: 2018-11-02 | DRG: 776 | Disposition: A | Discharge status: Home / Self Care | Payer: BC Managed Care – PPO | Source: Intra-hospital | Attending: Psychiatry | Admitting: Psychiatry

## 2018-11-01 DIAGNOSIS — F53 Postpartum depression: Secondary | ICD-10-CM | POA: Diagnosis present

## 2018-11-01 DIAGNOSIS — Z803 Family history of malignant neoplasm of breast: Secondary | ICD-10-CM | POA: Diagnosis not present

## 2018-11-01 DIAGNOSIS — Z1509 Genetic susceptibility to other malignant neoplasm: Secondary | ICD-10-CM | POA: Diagnosis not present

## 2018-11-01 DIAGNOSIS — F418 Other specified anxiety disorders: Secondary | ICD-10-CM | POA: Diagnosis present

## 2018-11-01 DIAGNOSIS — Z8041 Family history of malignant neoplasm of ovary: Secondary | ICD-10-CM | POA: Diagnosis not present

## 2018-11-01 DIAGNOSIS — O99345 Other mental disorders complicating the puerperium: Principal | ICD-10-CM | POA: Diagnosis present

## 2018-11-01 DIAGNOSIS — Z800 Family history of malignant neoplasm of digestive organs: Secondary | ICD-10-CM | POA: Diagnosis not present

## 2018-11-01 DIAGNOSIS — Z1159 Encounter for screening for other viral diseases: Secondary | ICD-10-CM | POA: Diagnosis not present

## 2018-11-01 DIAGNOSIS — Z87410 Personal history of cervical dysplasia: Secondary | ICD-10-CM

## 2018-11-01 DIAGNOSIS — Z8049 Family history of malignant neoplasm of other genital organs: Secondary | ICD-10-CM | POA: Diagnosis not present

## 2018-11-01 DIAGNOSIS — Z818 Family history of other mental and behavioral disorders: Secondary | ICD-10-CM

## 2018-11-01 LAB — SARS CORONAVIRUS 2 BY RT PCR (HOSPITAL ORDER, PERFORMED IN ~~LOC~~ HOSPITAL LAB): SARS Coronavirus 2: NEGATIVE

## 2018-11-01 MED ORDER — ACETAMINOPHEN 325 MG PO TABS: 650.0000 mg | ORAL_TABLET | Freq: Four times a day (QID) | ORAL | Status: DC | PRN

## 2018-11-01 MED ORDER — ESCITALOPRAM OXALATE 10 MG PO TABS
10.0000 mg | ORAL_TABLET | Freq: Every day | ORAL | Status: DC
  Administered 2018-11-01 – 2018-11-02 (×2): 10 mg via ORAL
  Filled 2018-11-01 (×2): qty 1

## 2018-11-01 MED ORDER — HYDROXYZINE HCL 25 MG PO TABS: 25.0000 mg | ORAL_TABLET | Freq: Four times a day (QID) | ORAL | Status: DC | PRN

## 2018-11-01 NOTE — Progress Notes (Signed)
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 This Clinical research associate spoke with MD and MD stated that she was fine with patient utilizing her mouth guard at bedtime.

## 2018-11-01 NOTE — BH Assessment (Addendum)
Assessment Note  Jessica Cardenas is an 33 y.o. female. Who reports having SI since birth of daughter 3 weeks ago. Reports she was seen by provider and started on zoloft 1 week ago. She reports experiencing restful sleep, that since starting the Zolft her mood has improved substantially and suicidal thoughts have since been resolved.  Patient reports after taking medication for 1 week, she is now having homicidal thoughts toward her daughter, onset yesterday. Although she felt better on today she states that while burping her child she began to experience intrusive thoughts. Pt reports thinking that she should not trust her self with her child. Pt and the pts husband confirm that the pt has no history of violent bx, no previous suicide attempts. Pts husband states " I am comfortable either way, But I feel like her being in the hospital for several days may be worse for her." As he states that they share responsibility of both the pts newborn and their three year old son.   TTS has recommended overnight observation as the pt states that she has had no intrusive thought since presenting to the ER. The pt denied active  SI/HI and AV/H. She has contracted for safety and has developed a safe discharge plan along with this Probation officer and the pts spouse. At this time protective factors outweigh risk factors. Safety plan is created jointly which involves patient following up with IOP.and receiving addition support from her sister who is in route from [place].     Patient is linear, direct, and organized; denies SI, HI, and AVH and is not currently significantly impaired, psychotic, or manic on exam. Detailed risk assessment is complete based on clinical exam and individual risk factors and acute suicide risk is low and acute violence risk is low. Individualized risk factors include: previous suicide and homicidal thoughts and individualized protective factors include: patient has treatable psychiatric disorders and  symptoms, protective religious beliefs, strong responsibility for children and family , positive family connectedness, feels supported, no access to firearms, optimism that change can occur, future oriented, participating in treatment plan and awareness of substance abuse issues.  EDP has disagreed with this recommendation and requested that the pt be admitted to the inpatient psychiatry unit. Case has been staffed with Dr Leverne Humbles, on call psychiatrist who has agree to submit admission orders.   Diagnosis: Post partum depression  Past Medical History:  Past Medical History:  Diagnosis Date  . 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 Lynch syndrome    per [facility] chart positive for MSH2- needs annual pelvic u/s, CA 125, endometrial bx, urine cytology, colonoscopy and endoscopy  . Lynch syndrome   . Post partum depression     Past Surgical History:  Procedure Laterality Date  . COLONOSCOPY N/A 01/2014, 2017   HX of Lynch Syndrome (genetic)  . DILATION AND EVACUATION N/A 09/02/2017   Procedure: DILATATION AND EVACUATION;  Surgeon: Malachy Mood, MD;  Location: [facility] ORS;  Service: Gynecology;  Laterality: N/A;  . ENDOMETRIAL BIOPSY  2017   normal  . ESOPHAGOGASTRODUODENOSCOPY ENDOSCOPY      Family History:  Family History  Problem Relation Age of Onset  . Cardiomyopathy Maternal Grandmother   . Breast cancer Maternal Grandmother 21  . Stomach cancer Paternal Grandmother 38  . Colon cancer Maternal Grandfather        Lynch syndrome  . Ovarian cancer Maternal Aunt 30       lynch positive  . Uterine cancer Maternal Aunt  Social History:  reports that she has never smoked. She has never used smokeless tobacco. She reports current alcohol use. She reports that she does not use drugs.  Additional Social History:  Alcohol / Drug Use Pain Medications: SEE MAR Prescriptions: SEE MAR Over the Counter: SEE MAR History of alcohol / drug use?: No history of alcohol / drug abuse  CIWA:  CIWA-Ar BP: 109/76 Pulse Rate: 61 COWS:    Allergies:  Allergies  Allergen Reactions  . Aloe Vera [Aloe] Rash  . Penicillins Rash    As infant Has patient had a PCN reaction causing immediate rash, facial/tongue/throat swelling, SOB or lightheadedness with hypotension: Unknown Has patient had a PCN reaction causing severe rash involving mucus membranes or skin necrosis: Unknown Has patient had a PCN reaction that required hospitalization: No Has patient had a PCN reaction occurring within the last 10 years: No If all of the above answers are "NO", then may proceed with Cephalosporin use.     Home Medications: (Not in a hospital admission)   OB/GYN Status:  Patient's last menstrual period was 01/04/2018 (exact date).  General Assessment Data Location of Assessment: [facility] ED TTS Assessment: In system Is this a Tele or Face-to-Face Assessment?: Tele Assessment Is this an Initial Assessment or a Re-assessment for this encounter?: Initial Assessment Patient Accompanied by:: N/A Living Arrangements: Other (Comment) What gender do you identify as?: Female Marital status: Married Living Arrangements: Spouse/significant other, Children Can pt return to current living arrangement?: Yes Admission Status: Voluntary Is patient capable of signing voluntary admission?: Yes Referral Source: Self/Family/Friend Insurance type: Administrator, arts Exam ([place]) Medical Exam completed: Yes  Crisis Care Plan Living Arrangements: Spouse/significant other, Children Legal Guardian: Other:(none ) Name of Psychiatrist: none  Name of Therapist: none   Education Status Is patient currently in school?: No  Risk to self with the past 6 months Suicidal Ideation: No Has patient been a risk to self within the past 6 months prior to admission? : No Suicidal Intent: No Has patient had any suicidal intent within the past 6 months prior to admission? : No Is patient at risk for  suicide?: No, but patient needs Medical Clearance Suicidal Plan?: No Has patient had any suicidal plan within the past 6 months prior to admission? : No Access to Means: No What has been your use of drugs/alcohol within the last 12 months?: none  Previous Attempts/Gestures: No How many times?: 0 Other Self Harm Risks: none  Triggers for Past Attempts: None known Intentional Self Injurious Behavior: None Family Suicide History: Unknown Recent stressful life event(s): Trauma (Comment) Persecutory voices/beliefs?: No Depression: Yes Depression Symptoms: Guilt, Tearfulness Substance abuse history and/or treatment for substance abuse?: No Suicide prevention information given to non-admitted patients: Not applicable  Risk to Others within the past 6 months Homicidal Ideation: No-Not Currently/Within Last 6 Months Does patient have any lifetime risk of violence toward others beyond the six months prior to admission? : No Thoughts of Harm to Others: No-Not Currently Present/Within Last 6 Months Current Homicidal Intent: No-Not Currently/Within Last 6 Months Current Homicidal Plan: No-Not Currently/Within Last 6 Months Identified Victim: Pts new born child  History of harm to others?: No Assessment of Violence: None Noted Violent Behavior Description: none  Does patient have access to weapons?: No Criminal Charges Pending?: No Does patient have a court date: No Is patient on probation?: No  Psychosis Hallucinations: None noted Delusions: None noted  Mental Status Report Appearance/Hygiene: In scrubs Eye  Contact: Good Motor Activity: Freedom of movement Speech: Logical/coherent Level of Consciousness: Alert Mood: Sad Affect: Anxious Anxiety Level: Minimal Thought Processes: Relevant, Coherent Judgement: Unimpaired Orientation: Time, Place, Person, Situation Obsessive Compulsive Thoughts/Behaviors: None  Cognitive Functioning Concentration: Normal Memory: Recent Intact,  Remote Intact Is patient IDD: No Insight: Fair Impulse Control: Good Appetite: Good Have you had any weight changes? : No Change Sleep: No Change Total Hours of Sleep: 7 Vegetative Symptoms: None  ADLScreening [facility] Assessment Services) Patient's cognitive ability adequate to safely complete daily activities?: Yes Patient able to express need for assistance with ADLs?: Yes Independently performs ADLs?: Yes (appropriate for developmental age)  Prior Inpatient Therapy Prior Inpatient Therapy: No  Prior Outpatient Therapy Prior Outpatient Therapy: No Does patient have an ACCT team?: No Does patient have Intensive In-House Services?  : No Does patient have [facility] services? : No Does patient have P4CC services?: No  ADL Screening (condition at time of admission) Patient's cognitive ability adequate to safely complete daily activities?: Yes Patient able to express need for assistance with ADLs?: Yes Independently performs ADLs?: Yes (appropriate for developmental age)       Abuse/Neglect Assessment (Assessment to be complete while patient is alone) Physical Abuse: Denies Verbal Abuse: Denies Sexual Abuse: Denies Exploitation of patient/patient's resources: Denies Self-Neglect: Denies Values / Beliefs Cultural Requests During Hospitalization: None Consults Spiritual Care Consult Needed: No Social Work Consult Needed: No Regulatory affairs officer (For Healthcare) Does Patient Have a Medical Advance Directive?: Yes Does patient want to make changes to medical advance directive?: No - Patient declined Type of Advance Directive: Living will Copy of Living Will in Chart?: No - copy requested          Disposition:  Disposition Initial Assessment Completed for this Encounter: Yes Patient referred to: Other (Comment)  On Site Evaluation by:   Reviewed with Physician:    Laretta Alstrom 11/01/2018 12:24 AM

## 2018-11-01 NOTE — Plan of Care (Signed)
D- Patient alert and oriented. Patient presents in a pleasant mood on assessment stating that she didn't sleep too great last night because "I got transferred at like two in the morning", however, she had no major complaints or concerns to voice to this Clinical research associate. Patient denies SI, HI, AVH, and pain at this time. Patient also denies any signs/symptoms of depression stating "I'm not depressed, but I know I can cry at a drop of a hat". Patient had no stated goals for today.  A- Support and encouragement provided. Routine safety checks conducted every 15 minutes. Patient informed to notify staff with problems or concerns.  R- Patient contracts for safety at this time. Patient compliant with medications and treatment plan. Patient receptive, calm, and cooperative. Patient interacts well with others on the unit.  Patient remains safe at this time.  Problem: Activity: Goal: Interest or engagement in leisure activities will improve Outcome: Progressing Goal: Imbalance in normal sleep/wake cycle will improve Outcome: Progressing   Problem: Coping: Goal: Coping ability will improve Outcome: Progressing Goal: Will verbalize feelings Outcome: Progressing   Problem: Health Behavior/Discharge Planning: Goal: Ability to make decisions will improve Outcome: Progressing Goal: Compliance with therapeutic regimen will improve Outcome: Progressing

## 2018-11-01 NOTE — ED Notes (Signed)
Patient is to be admitted to [facility] by Dr. Viviano Simas.  Attending Physician will be Dr. Toni Amend.   Patient has been assigned to room 305, by [facility] Charge Nurse B. Derrill Kay   Intake Paper Work has been signed and placed on patient chart.  ER staff is aware of the admission:  ER Secretary    ER MD   Tobi Bastos Patient's Nurse    Patient Access.

## 2018-11-01 NOTE — Plan of Care (Signed)
  Problem: Activity: Goal: Interest or engagement in leisure activities will improve 11/01/2018 2036 by Gaetana Michaelis, RN Outcome: Progressing 11/01/2018 2023 by Gaetana Michaelis, RN Outcome: Progressing   Problem: Activity: Goal: Imbalance in normal sleep/wake cycle will improve 11/01/2018 2036 by Gaetana Michaelis, RN Outcome: Progressing 11/01/2018 2023 by Gaetana Michaelis, RN Outcome: Progressing   Problem: Coping: Problem: Health Behavior/Discharge Planning: Goal: Ability to make decisions will improve 11/01/2018 2036 by Gaetana Michaelis, RN Outcome: Progressing 11/01/2018 2023 by Gaetana Michaelis, RN Outcome: Progressing   Problem: Health Behavior/Discharge Planning: Goal: Compliance with therapeutic regimen will improve 11/01/2018 2036 by Gaetana Michaelis, RN Outcome: Progressing 11/01/2018 2023 by Gaetana Michaelis, RN Outcome: Progressing   Goal: Coping ability will improve 11/01/2018 2036 by Gaetana Michaelis, RN Outcome: Progressing 11/01/2018 2023 by Gaetana Michaelis, RN Outcome: Progressing     Problem: Health Behavior/Discharge Planning: Goal: Ability to make decisions will improve 11/01/2018 2036 by Gaetana Michaelis, RN Outcome: Progressing 11/01/2018 2023 by Gaetana Michaelis, RN Outcome: Progressing

## 2018-11-01 NOTE — BHH Suicide Risk Assessment (Signed)
[facility] Admission Suicide Risk Assessment   Nursing information obtained from:    Demographic factors:  Caucasian Current Mental Status:  Self-harm thoughts Loss Factors:  NA Historical Factors:  NA Risk Reduction Factors:  Responsible for children under 35 years of age, Sense of responsibility to family  Total Time spent with patient: 30 minutes Principal Problem: <principal problem not specified> Diagnosis:  Active Problems:   Postpartum depression  Ms. Jessica Cardenas a 33 y.o.femalewith a history of postpartum depressionwho presented to the ED for having thoughts of wanting to hurt her newborn baby. Patient admitted to inpatient psych unit voluntary for safety and stabilization.  Subjective Data:  Patient interviewed in the unit today. She denies feeling depressed, suicidal, homicidal. Her recent thoughts appears to be intrusive thoughts, not real homicidal thoughts. She does not appear to be psychotic or at risk to harm self or others. Patient relates her recent intrusive thoughts of harming baby to recently started Zoloft. She stopped the medication, but is interested in starting a different antidepressant.  Continued Clinical Symptoms:    The "Alcohol Use Disorders Identification Test", Guidelines for Use in Primary Care, Second Edition.  World Science writer [facility]). Score between 0-7:  no or low risk or alcohol related problems. Score between 8-15:  moderate risk of alcohol related problems. Score between 16-19:  high risk of alcohol related problems. Score 20 or above:  warrants further diagnostic evaluation for alcohol dependence and treatment.   CLINICAL FACTORS:   no  Psychiatric Specialty Exam: See H&P  COGNITIVE FEATURES THAT CONTRIBUTE TO RISK:  Cardenas    SUICIDE RISK:   Mild:  Suicidal ideation of limited frequency, intensity, duration, and specificity.  There are no identifiable plans, no associated intent, mild dysphoria and related symptoms, good self-control  (both objective and subjective assessment), few other risk factors, and identifiable protective factors, including available and accessible social support.  Patient has no access to guns.  PLAN OF CARE: hospital admission, outpatient MH resources, antidepressive medication.  I certify that inpatient services furnished can reasonably be expected to improve the patient's condition.   Thalia Party, MD 11/01/2018, 3:45 PM

## 2018-11-01 NOTE — Plan of Care (Signed)
Patient admitted from the emergency department for Postpartum Depression. She states her baby is 44 weeks old. The patient reports thoughts of not wanting to be here any longer and having thoughts of hurting her daughter after starting an antidepressant about a week ago. She is Ox4, cooperative on treatment, she reports depression but denies suicidal ideation. Patient is currently resting in the bed quietly.

## 2018-11-01 NOTE — Progress Notes (Signed)
Per pt and MD request, pt was given her mouthguard. Pt denies SI, HI, AVH. Pt has no complaints and is in no apparent distress at this time. Will continue to monitor.

## 2018-11-01 NOTE — Plan of Care (Signed)
Pt calm and cooperative. Her affect is flat, but brightens some with interaction. She reports she had a pleasant day. She denies needs or concerns. No distress noted or needs expressed. She contracts for safety. Will continue to maintain q15 safety checks.

## 2018-11-01 NOTE — [facility] Group Notes (Signed)
LCSW Group Therapy Note 11/01/2018 1:15pm  Type of Therapy and Topic: Group Therapy: Feelings Around Returning Home & Establishing a Supportive Framework and Supporting Oneself When Supports Not Available  Participation Level: Active  Description of Group:  Patients first processed thoughts and feelings about upcoming discharge. These included fears of upcoming changes, lack of change, new living environments, judgements and expectations from others and overall stigma of mental health issues. The group then discussed the definition of a supportive framework, what that looks and feels like, and how do to discern it from an unhealthy non-supportive network. The group identified different types of supports as well as what to do when your family/friends are less than helpful or unavailable  Therapeutic Goals  1. Patient will identify one healthy supportive network that they can use at discharge. 2. Patient will identify one factor of a supportive framework and how to tell it from an unhealthy network. 3. Patient able to identify one coping skill to use when they do not have positive supports from others. 4. Patient will demonstrate ability to communicate their needs through discussion and/or role plays.  Summary of Patient Progress:  The patient reported he feels "much better than yesterday." Pt engaged during group session. As patients processed their anxiety about discharge and described healthy supports patient shared she is ready to be discharge. She stated, "I feel better that I am off my meds."  Patients identified at least one self-care tool they were willing to use after discharge.   Therapeutic Modalities Cognitive Behavioral Therapy Motivational Interviewing   Johnnye Sima, LCSW 11/01/2018 12:36 PM

## 2018-11-01 NOTE — H&P (Signed)
Psychiatric Admission Assessment Adult  Patient Identification: Jessica Cardenas MRN:  [id number] Date of Evaluation:  11/01/2018 Chief Complaint:  Post partum depression Principal Diagnosis: <principal problem not specified> Diagnosis:  Active Problems:   Postpartum depression  Ms. Jessica Cardenas is a 33 y.o. female with a history of postpartum depression who presented to the ED for having thoughts of wanting to hurt her newborn baby. Patient admitted to inpatient psych unit voluntary for safety and stabilization.  History of Present Illness:  Patient reports she is 3 weeks postpartum. She reports depressed mood with thoughts that she should disappear but has not had any specific suicidal plan for last two weeks and that she was started on Zoloft about a week ago. She reports that her mood improved significantly after starting Zoloft - she was feeling less depressed, having better sleep, no suicidal thoughts. Patient reports that yesterday while burping her child she began to experience intrusive thoughts of what the baby's neck would look like if the neck was broken. She felt incredible scared and shame from this and has not been able to get over these feelings. She asked to bring her to the ED. She was assessed by [facility] and recommended for admission. She reports that she is feeling "nornal" today. She denies feeling depressed, suicidal, homicidal. She denies any hallucinations, manic symptoms, does not express delusions. Patient reports very good family support. She is religious. Patient stopped Zoloft prior to hospital. She reports that she is willing to try a different antidepressant as she is afraid that her depression can came without a treatment. She is willing to start a medication today to check for side effects. She reports feeling safe for discharge home tomorrow.  Per TTS note: "Pt and the pts husband confirm that the pt has no history of violent bx, no previous suicide attempts. Pts husband  states " I am comfortable either way, But I feel like her being in the hospital for several days may be worse for her." As he states that they share responsibility of both the pts newborn and their three year old son. TTS has recommended overnight observation as the pt states that she has had no intrusive thought since presenting to the ER. The pt denied active  SI/HI and AV/H. She has contracted for safety and has developed a safe discharge plan along with this Probation officer and the pts spouse. At this time protective factors outweigh risk factors. Safety plan is created jointly which involves patient following up with IOP.and receiving addition support from her sister who is in route from [place]."  PMH: Lynch syndrome  Past Psych Hx: Patient denies inpatient psych admissions, h/o suicidal attempts. Past psych med: Zoloft.  Social Hx: Patient lives with husband and two children (51yo and 5 weeks old). Denies h/o trauma. She is working remotely. She reports she has no access to firearms. Denies legal history. She denies any substance use.      Associated Signs/Symptoms: Depression Symptoms:  Denies current sx (Hypo) Manic Symptoms: denies Anxiety Symptoms: anxkious due to being away from her children Psychotic Symptoms:  denies PTSD Symptoms: denies  Total Time spent with patient: 30 minutes  Is the patient at risk to self? No.  Has the patient been a risk to self in the past 6 months? No.  Has the patient been a risk to self within the distant past? No.  Is the patient a risk to others? No.  Has the patient been a risk to others in the past 6 months?  No.  Has the patient been a risk to others within the distant past? No.   Prior Inpatient Therapy:   Prior Outpatient Therapy:    Alcohol Screening: 1. How often do you have a drink containing alcohol?: Monthly or less 2. How many drinks containing alcohol do you have on a typical day when you are drinking?: 1 or 2 3. How often do you have six  or more drinks on one occasion?: Less than monthly AUDIT-C Score: 2 Alcohol Brief Interventions/Follow-up: AUDIT Score <7 follow-up not indicated Substance Abuse History in the last 12 months:  No. Consequences of Substance Abuse: Negative Previous Psychotropic Medications: Yes  Psychological Evaluations: No  Past Medical History:  Past Medical History:  Diagnosis Date  . 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 Lynch syndrome    per [facility] chart positive for MSH2- needs annual pelvic u/s, CA 125, endometrial bx, urine cytology, colonoscopy and endoscopy  . Lynch syndrome   . Post partum depression     Past Surgical History:  Procedure Laterality Date  . COLONOSCOPY N/A 01/2014, 2017   HX of Lynch Syndrome (genetic)  . DILATION AND EVACUATION N/A 09/02/2017   Procedure: DILATATION AND EVACUATION;  Surgeon: Malachy Mood, MD;  Location: [facility] ORS;  Service: Gynecology;  Laterality: N/A;  . ENDOMETRIAL BIOPSY  2017   normal  . ESOPHAGOGASTRODUODENOSCOPY ENDOSCOPY     Family History:  Family History  Problem Relation Age of Onset  . Cardiomyopathy Maternal Grandmother   . Breast cancer Maternal Grandmother 58  . Stomach cancer Paternal Grandmother 66  . Colon cancer Maternal Grandfather        Lynch syndrome  . Ovarian cancer Maternal Aunt 30       lynch positive  . Uterine cancer Maternal Aunt    Family Psychiatric  History: patient denies Tobacco Screening: Have you used any form of tobacco in the last 30 days? (Cigarettes, Smokeless Tobacco, Cigars, and/or Pipes): No Social History:  Social History   Substance and Sexual Activity  Alcohol Use Yes   Comment: occasional     Social History   Substance and Sexual Activity  Drug Use No    Additional Social History: see above  Allergies:   Allergies  Allergen Reactions  . Aloe Vera [Aloe] Rash  . Penicillins Rash    As infant Has patient had a PCN reaction causing immediate rash, facial/tongue/throat swelling, SOB or  lightheadedness with hypotension: Unknown Has patient had a PCN reaction causing severe rash involving mucus membranes or skin necrosis: Unknown Has patient had a PCN reaction that required hospitalization: No Has patient had a PCN reaction occurring within the last 10 years: No If all of the above answers are "NO", then may proceed with Cephalosporin use.    Lab Results:  Results for orders placed or performed during the hospital encounter of 10/31/18 (from the past 48 hour(s))  Comprehensive metabolic panel     Status: None   Collection Time: 10/31/18  6:01 PM  Result Value Ref Range   Sodium 138 135 - 145 mmol/L   Potassium 3.5 3.5 - 5.1 mmol/L   Chloride 107 98 - 111 mmol/L   CO2 23 22 - 32 mmol/L   Glucose, Bld 91 70 - 99 mg/dL   BUN 11 6 - 20 mg/dL   Creatinine, Ser 0.68 0.44 - 1.00 mg/dL   Calcium 9.0 8.9 - 10.3 mg/dL   Total Protein 7.7 6.5 - 8.1 g/dL   Albumin 4.2 3.5 -  5.0 g/dL   AST 17 15 - 41 U/L   ALT 14 0 - 44 U/L   Alkaline Phosphatase 101 38 - 126 U/L   Total Bilirubin 0.6 0.3 - 1.2 mg/dL   GFR calc non Af Amer >60 >60 mL/min   GFR calc Af Amer >60 >60 mL/min   Anion gap 8 5 - 15    Comment: Performed at [facility], [address] [place], [place] [address]  Ethanol     Status: None   Collection Time: 10/31/18  6:01 PM  Result Value Ref Range   Alcohol, Ethyl (B) <10 <10 mg/dL    Comment: (NOTE) Lowest detectable limit for serum alcohol is 10 mg/dL. For medical purposes only. Performed at [facility], [address] [place], [place] [address]   Salicylate level     Status: None   Collection Time: 10/31/18  6:01 PM  Result Value Ref Range   Salicylate Lvl <9.5 2.8 - 30.0 mg/dL    Comment: Performed at [facility], [address] [place], [place] [address]  Acetaminophen level     Status: Abnormal   Collection Time: 10/31/18  6:01 PM  Result Value Ref Range   Acetaminophen (Tylenol), Serum <10 (L) 10 - 30 ug/mL     Comment: (NOTE) Therapeutic concentrations vary significantly. A range of 10-30 ug/mL  may be an effective concentration for many patients. However, some  are best treated at concentrations outside of this range. Acetaminophen concentrations >150 ug/mL at 4 hours after ingestion  and >50 ug/mL at 12 hours after ingestion are often associated with  toxic reactions. Performed at [facility], [address] [place], [place] [address]   cbc     Status: Abnormal   Collection Time: 10/31/18  6:01 PM  Result Value Ref Range   WBC 10.6 (H) 4.0 - 10.5 K/uL   RBC 4.67 3.87 - 5.11 MIL/uL   Hemoglobin 13.1 12.0 - 15.0 g/dL   HCT 39.4 36.0 - 46.0 %   MCV 84.4 80.0 - 100.0 fL   MCH 28.1 26.0 - 34.0 pg   MCHC 33.2 30.0 - 36.0 g/dL   RDW 12.0 11.5 - 15.5 %   Platelets 245 150 - 400 K/uL   nRBC 0.0 0.0 - 0.2 %    Comment: Performed at [facility], [address] [place],  [address]  Urine Drug Screen, Qualitative     Status: None   Collection Time: 10/31/18  6:01 PM  Result Value Ref Range   Tricyclic, Ur Screen NONE DETECTED NONE DETECTED   Amphetamines, Ur Screen NONE DETECTED NONE DETECTED   MDMA (Ecstasy)Ur Screen NONE DETECTED NONE DETECTED   Cocaine Metabolite,Ur [place] NONE DETECTED NONE DETECTED   Opiate, Ur Screen NONE DETECTED NONE DETECTED   Phencyclidine (PCP) Ur S NONE DETECTED NONE DETECTED   Cannabinoid 50 Ng, Ur [place] NONE DETECTED NONE DETECTED   Barbiturates, Ur Screen NONE DETECTED NONE DETECTED   Benzodiazepine, Ur Scrn NONE DETECTED NONE DETECTED   Methadone Scn, Ur NONE DETECTED NONE DETECTED    Comment: (NOTE) Tricyclics + metabolites, urine    Cutoff 1000 ng/mL Amphetamines + metabolites, urine  Cutoff 1000 ng/mL MDMA (Ecstasy), urine              Cutoff 500 ng/mL Cocaine Metabolite, urine          Cutoff 300 ng/mL Opiate + metabolites, urine        Cutoff 300 ng/mL Phencyclidine (  PCP), urine         Cutoff 25 ng/mL Cannabinoid, urine                  Cutoff 50 ng/mL Barbiturates + metabolites, urine  Cutoff 200 ng/mL Benzodiazepine, urine              Cutoff 200 ng/mL Methadone, urine                   Cutoff 300 ng/mL The urine drug screen provides only a preliminary, unconfirmed analytical test result and should not be used for non-medical purposes. Clinical consideration and professional judgment should be applied to any positive drug screen result due to possible interfering substances. A more specific alternate chemical method must be used in order to obtain a confirmed analytical result. Gas chromatography / mass spectrometry (GC/MS) is the preferred confirmat ory method. Performed at [facility], [address] [place], [place] [address]   Pregnancy, urine POC     Status: None   Collection Time: 10/31/18  6:06 PM  Result Value Ref Range   Preg Test, Ur NEGATIVE NEGATIVE    Comment:        THE SENSITIVITY OF THIS METHODOLOGY IS >24 mIU/mL   SARS Coronavirus 2 (CEPHEID - Performed in [place] hospital lab), Hosp Order     Status: None   Collection Time: 11/01/18  2:04 AM  Result Value Ref Range   SARS Coronavirus 2 NEGATIVE NEGATIVE    Comment: (NOTE) If result is NEGATIVE SARS-CoV-2 target nucleic acids are NOT DETECTED. The SARS-CoV-2 RNA is generally detectable in upper and lower  respiratory specimens during the acute phase of infection. The lowest  concentration of SARS-CoV-2 viral copies this assay can detect is 250  copies / mL. A negative result does not preclude SARS-CoV-2 infection  and should not be used as the sole basis for treatment or other  patient management decisions.  A negative result may occur with  improper specimen collection / handling, submission of specimen other  than nasopharyngeal swab, presence of viral mutation(s) within the  areas targeted by this assay, and inadequate number of viral copies  (<250 copies / mL). A negative result must be combined with clinical   observations, patient history, and epidemiological information. If result is POSITIVE SARS-CoV-2 target nucleic acids are DETECTED. The SARS-CoV-2 RNA is generally detectable in upper and lower  respiratory specimens dur ing the acute phase of infection.  Positive  results are indicative of active infection with SARS-CoV-2.  Clinical  correlation with patient history and other diagnostic information is  necessary to determine patient infection status.  Positive results do  not rule out bacterial infection or co-infection with other viruses. If result is PRESUMPTIVE POSTIVE SARS-CoV-2 nucleic acids MAY BE PRESENT.   A presumptive positive result was obtained on the submitted specimen  and confirmed on repeat testing.  While 2019 novel coronavirus  (SARS-CoV-2) nucleic acids may be present in the submitted sample  additional confirmatory testing may be necessary for epidemiological  and / or clinical management purposes  to differentiate between  SARS-CoV-2 and other Sarbecovirus currently known to infect humans.  If clinically indicated additional testing with an alternate test  methodology [phone]) is advised. The SARS-CoV-2 RNA is generally  detectable in upper and lower respiratory sp ecimens during the acute  phase of infection. The expected result is Negative. Fact Sheet for Patients:  StrictlyIdeas.no Fact Sheet for Healthcare Providers: BankingDealers.co.za This test is  not yet approved or cleared by the [place] and has been authorized for detection and/or diagnosis of SARS-CoV-2 by FDA under an Emergency Use Authorization (EUA).  This EUA will remain in effect (meaning this test can be used) for the duration of the COVID-19 declaration under Section 564(b)(1) of the Act, 21 U.S.C. section 360bbb-3(b)(1), unless the authorization is terminated or revoked sooner. Performed at [facility], [address] [place], [place] [address]     Blood Alcohol level:  Lab Results  Component Value Date   [facility] <10 60/45/4098    Metabolic Disorder Labs:  No results found for: HGBA1C, MPG No results found for: PROLACTIN No results found for: CHOL, TRIG, HDL, CHOLHDL, VLDL, LDLCALC  Current Medications: Current Facility-Administered Medications  Medication Dose Route Frequency Provider Last Rate Last Dose  . acetaminophen (TYLENOL) tablet 650 mg  650 mg Oral Q6H PRN Lavella Hammock, MD      . escitalopram (LEXAPRO) tablet 10 mg  10 mg Oral Daily Larita Fife, MD      . hydrOXYzine (ATARAX/VISTARIL) tablet 25 mg  25 mg Oral Q6H PRN Lavella Hammock, MD       PTA Medications: Medications Prior to Admission  Medication Sig Dispense Refill Last Dose  . acetaminophen (TYLENOL) 325 MG tablet Take 2 tablets (650 mg total) by mouth every 4 (four) hours as needed for mild pain or moderate pain (for pain scale < 4).   prn at prn  . ibuprofen (ADVIL,MOTRIN) 600 MG tablet Take 1 tablet (600 mg total) by mouth every 6 (six) hours. 30 tablet 1 prn at prn  . omeprazole (PRILOSEC) 20 MG capsule Take 1 capsule (20 mg total) by mouth daily. 30 capsule 11 unknown at unknown  . prenatal vitamin w/FE, FA (NATACHEW) 29-1 MG CHEW chewable tablet Chew 1 tablet by mouth daily at 12 noon.   Past Month at Unknown time  . sertraline (ZOLOFT) 50 MG tablet Take 1 tablet (50 mg total) by mouth daily. 30 tablet 2 unknown at unknown    Musculoskeletal: Strength & Muscle Tone: within normal limits Gait & Station: normal Patient leans: N/A  Psychiatric Specialty Exam: Physical Exam  Constitutional: She is oriented to person, place, and time. She appears well-developed and well-nourished.  HENT:  Head: Normocephalic and atraumatic.  Eyes: Pupils are equal, round, and reactive to light. Conjunctivae and EOM are normal.  Neck: Normal range of motion. Neck supple.  Cardiovascular: Normal rate and regular rhythm.  Respiratory:  Effort normal and breath sounds normal.  GI: Soft.  Musculoskeletal: Normal range of motion.  Neurological: She is alert and oriented to person, place, and time.  Skin: Skin is warm and dry.    Review of Systems  Constitutional: Negative for chills, fever, malaise/fatigue and weight loss.  HENT: Negative for congestion, ear pain, hearing loss and sore throat.   Eyes: Negative for blurred vision, double vision, pain, discharge and redness.  Respiratory: Negative for cough, hemoptysis, sputum production, shortness of breath and wheezing.   Cardiovascular: Negative for chest pain, palpitations and orthopnea.  Gastrointestinal: Negative for abdominal pain, constipation, diarrhea, heartburn, nausea and vomiting.  Genitourinary: Negative for dysuria.  Musculoskeletal: Negative for myalgias.  Skin: Negative for itching and rash.  Neurological: Negative for dizziness, seizures, weakness and headaches.  Psychiatric/Behavioral: Positive for depression. Negative for hallucinations, memory loss, substance abuse and suicidal ideas. The patient is nervous/anxious. The patient does not have insomnia.     Blood pressure (!) 112/98,  pulse 82, temperature 98.4 F (36.9 C), temperature source Oral, resp. rate 16, height '5\' 4"'  (1.626 m), weight 88.5 kg, last menstrual period 01/04/2018, SpO2 100 %, unknown if currently breastfeeding.Body mass index is 33.47 kg/m.  General Appearance: Casual and Fairly Groomed  Eye Contact:  Good  Speech:  Normal Rate  Volume:  Normal  Mood:  Euthymic  Affect:  Appropriate, Congruent and Full Range  Thought Process:  Coherent, Goal Directed and Linear  Orientation:  Full (Time, Place, and Person)  Thought Content:  Logical  Suicidal Thoughts:  No  Homicidal Thoughts:  No  Memory:  Immediate;   Good Recent;   Good Remote;   Good  Judgement:  Good  Insight:  Good  Psychomotor Activity:  Normal  Concentration:  Concentration: Good and Attention Span: Good  Recall:   Good  Fund of Knowledge:  Good  Language:  Good  Akathisia:  No  Handed:  Right  AIMS (if indicated):     Assets:  Communication Skills Desire for Improvement Financial Resources/Insurance Housing Physical Health Social Support Vocational/Educational  ADL's:  Intact  Cognition:  WNL  Sleep:       Treatment Plan Summary: Daily contact with patient to assess and evaluate symptoms and progress in treatment  Observation Level/Precautions:  15 minute checks  Laboratory:    Psychotherapy:    Medications:    Consultations:    Discharge Concerns:    Estimated LOS:  Other:     Physician Treatment Plan for Primary Diagnosis: <principal problem not specified> Long Term Goal(s): Improvement in symptoms so as ready for discharge  Short Term Goals: Ability to identify changes in lifestyle to reduce recurrence of condition will improve, Ability to verbalize feelings will improve, Ability to disclose and discuss suicidal ideas, Ability to demonstrate self-control will improve and Ability to identify and develop effective coping behaviors will improve  Physician Treatment Plan for Secondary Diagnosis: Active Problems:   Postpartum depression  Long Term Goal(s): Improvement in symptoms so as ready for discharge  Short Term Goals: Ability to disclose and discuss suicidal ideas, Ability to demonstrate self-control will improve and Ability to identify and develop effective coping behaviors will improve  I certify that inpatient services furnished can reasonably be expected to improve the patient's condition.    ASSESSMENT: Ms. Moman is a 33 y.o. female with a history of postpartum depression who presented to the ED for having thoughts of wanting to hurt her newborn baby. Patient admitted to inpatient psych unit voluntary for safety and stabilization. Patient interviewed in the unit today. She denies feeling depressed, suicidal, homicidal. Her recent thoughts appears to be intrusive  thoughts, not real homicidal thoughts. She does not appear to be psychotic or at risk to harm self or others. Patient relates her recent intrusive thoughts of harming baby to recently started Zoloft. She stopped the medication, but is interested in starting a different antidepressant. We discussed medication options, their risks and benefits. Patient is agreeable to start Lexapro, which associated with low risk of infant harm based on human data, patient is aware about conflicting human data re effects on milk production.  Impression: Postpartum depression  Plan: -continue inpatient hospital admission; 15-minute checks; daily contact with patient to assess and evaluate symptoms and progress in treatment; psychoeducation; encourage participation in milieu activities.   -start Lexapro 17m PO daily for depression, anxiety - first dose today.  -SW consult for [facility].   -Disposition: anticipated discharge home early next week.  ADelrae Rend  Danella Sensing, MD 5/10/20203:11 PM

## 2018-11-01 NOTE — BHH Counselor (Signed)
Adult Comprehensive Assessment  Patient ID: Jessica Cardenas, female   DOB: 1985/10/12, 33 y.o.   MRN: [id number]  Information Source: Information source: Patient  Current Stressors:  Patient states their primary concerns and needs for treatment are:: "I was having negatives thoughts about what would happen if I harm my 713-week old daughter" Patient states their goals for this hospitilization and ongoing recovery are:: "the thoughts to be goneAnimator" Educational / Learning stressors: none reported Employment / Job issues: none reported Family Relationships: "goodEngineer, petroleum" Financial / Lack of resources (include bankruptcy): employed Housing / Lack of housing: stable Physical health (include injuries & life threatening diseases): none reported Social relationships: good Substance abuse: pt denies use Bereavement / Loss: none reported   Living/Environment/Situation:  Living Arrangements: Spouse/significant other, Children Who else lives in the home?: husband, 3yo son and 223 week old daughter How long has patient lived in current situation?: 5 years What is atmosphere in current home: Comfortable, ParamedicLoving  Family History:  Marital status: Married Number of Years Married: 5 Are you sexually active?: Yes What is your sexual orientation?: heterosexual Has your sexual activity been affected by drugs, alcohol, medication, or emotional stress?: no Does patient have children?: Yes How many children?: 0 How is patient's relationship with their children?: great  Childhood History:  By whom was/is the patient raised?: Both parents Additional childhood history information:   Description of patient's relationship with caregiver when they were a child: good Patient's description of current relationship with people who raised him/her: good How were you disciplined when you got in trouble as a child/adolescent?: grounded Does patient have siblings?: Yes Number of Siblings: 4 Description of patient's current  relationship with siblings: good Did patient suffer any verbal/emotional/physical/sexual abuse as a child?: No Did patient suffer from severe childhood neglect?: No Has patient ever been sexually abused/assaulted/raped as an adolescent or adult?: No Was the patient ever a victim of a crime or a disaster?: No Witnessed domestic violence?: No Has patient been effected by domestic violence as an adult?: No  Education:  Highest grade of school patient has completed: Child psychotherapistMaster degree Currently a student?: No Learning disability?: No  Employment/Work Situation:   Employment situation: Employed Where is patient currently employed?: [employer] How long has patient been employed?: 3 years Patient's job has been impacted by current illness: No What is the longest time patient has a held a job?: 5 years Where was the patient employed at that time?: [employer] Did You Receive Any Psychiatric Treatment/Services While in the [employer]?: No Are There Guns or Other Weapons in Your Home?: No  Financial Resources:   Financial resources: Income from employment Does patient have a representative payee or guardian?: No  Alcohol/Substance Abuse:   What has been your use of drugs/alcohol within the last 12 months?: pt denies use If attempted suicide, did drugs/alcohol play a role in this?: No Alcohol/Substance Abuse Treatment Hx: Denies past history Has alcohol/substance abuse ever caused legal problems?: No  Social Support System:   Conservation officer, natureatient's Community Support System: Good Describe Community Support System: family Type of faith/religion: none reported  Leisure/Recreation:   Leisure and Hobbies: journaling, gardening, puzzles  Strengths/Needs:   What is the patient's perception of their strengths?: organization and leadership skills, good under pressure Patient states they can use these personal strengths during their treatment to contribute to their recovery: "not having a  structure is good" Patient states these barriers may affect/interfere with their treatment: no Patient states these barriers may affect their  return to the community: no  Discharge Plan:   Currently receiving community mental health services: No Patient states concerns and preferences for aftercare planning are: Pt reports that she would like to be referred to a therapist in her area, she is going to continue med mgt through her OBGYN. Patient states they will know when they are safe and ready for discharge when: "I am ready now" Does patient have access to transportation?: Yes Does patient have financial barriers related to discharge medications?: No Will patient be returning to same living situation after discharge?: Yes  Summary/Recommendations:   Summary and Recommendations (to be completed by the evaluator): Patient is a 33 year old female admitted involuntarily and diagnosed with Post-Partum Depression.  Patient admitted from the emergency department for Postpartum Depression. She states her baby is 38 weeks old. The patient reports thoughts of not wanting to be here any longer and having thoughts of hurting her daughter after starting an antidepressant about a week ago. Patient will benefit from crisis stabilization, medication evaluation, group therapy and psychoeducation. In addition to case management for discharge planning. At discharge it is recommended that patient adhere to the established discharge plan and continue treatment.   Jessica Cardenas  CUEBAS-COLON. 11/01/2018

## 2018-11-01 NOTE — ED Notes (Addendum)
3 bottles of breast milk sent to third floor for storage until patients husband picks up. Doris, Consulting civil engineer on Mother /Baby unit informed pt going to BMU and that the loaned breast pump is going down with her.

## 2018-11-02 DIAGNOSIS — F53 Postpartum depression: Secondary | ICD-10-CM

## 2018-11-02 DIAGNOSIS — O99345 Other mental disorders complicating the puerperium: Principal | ICD-10-CM

## 2018-11-02 MED ORDER — ESCITALOPRAM OXALATE 10 MG PO TABS: 10.0000 mg | ORAL_TABLET | Freq: Every day | ORAL | 1 refills | Status: DC

## 2018-11-02 NOTE — BHH Group Notes (Signed)
Overcoming Obstacles  11/02/2018 1PM  Type of Therapy and Topic:  Group Therapy:  Overcoming Obstacles  Participation Level:  Active    Description of Group:    In this group patients will be encouraged to explore what they see as obstacles to their own wellness and recovery. They will be guided to discuss their thoughts, feelings, and behaviors related to these obstacles. The group will process together ways to cope with barriers, with attention given to specific choices patients can make. Each patient will be challenged to identify changes they are motivated to make in order to overcome their obstacles. This group will be process-oriented, with patients participating in exploration of their own experiences as well as giving and receiving support and challenge from other group members.   Therapeutic Goals: 1. Patient will identify personal and current obstacles as they relate to admission. 2. Patient will identify barriers that currently interfere with their wellness or overcoming obstacles.  3. Patient will identify feelings, thought process and behaviors related to these barriers. 4. Patient will identify two changes they are willing to make to overcome these obstacles:      Summary of Patient Progress  Patient identified feeling overwhelmed by the task ahead of her when she goes home as a challenge. Actively and appropriately engaged in the group. Patient was able to provide support and validation to other group members.Patient practiced active listening when interacting with the facilitator and other group members. Group members suggested to her prioritizing things of importance first and completing other task later.   Therapeutic Modalities:   Cognitive Behavioral Therapy Solution Focused Therapy Motivational Interviewing Relapse Prevention Therapy    Lowella Dandy, MSW, LCSW 11/02/2018 1:56 PM

## 2018-11-02 NOTE — Progress Notes (Signed)
Patient alert and oriented x 4. Ambulates unit with steady gait. Verbally denies SI/HI/AVH and pain. Patient discharged on above date and time. Verbalized understanding the discharge information provided to patient upon discharge. Patient departed unit with discharge paperwork, prescriptions and personal belongings. Picked up by her husband and plans to follow up with [facility] for outpatient services.

## 2018-11-02 NOTE — Discharge Summary (Signed)
Physician Discharge Summary Note  Patient:  Jessica Cardenas is an 33 y.o., female MRN:  [id number] DOB:  1986-02-14 Patient phone:  [phone] (home)  Patient address:   [address] [address],  Total Time spent with patient: 1 hour  Date of Admission:  11/01/2018 Date of Discharge: Nov 02, 2018  Reason for Admission: Patient admitted through the emergency room where she presented with a report of intrusive ego dystonic thoughts about injuring her infant child.  Symptoms also of some anxiety and depression  Principal Problem: Postpartum depression Discharge Diagnoses: Principal Problem:   Postpartum depression   Past Psychiatric History: Patient has a history of anxiety treated with psychotherapy in the past.  Had recently been prescribed Zoloft by OB/GYN provider just about a week before coming to the hospital.  No other history of psychiatric medicine.  No history of psychosis no history of suicide no history of violence  Past Medical History:  Past Medical History:  Diagnosis Date  . 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 Lynch syndrome    per [facility] chart positive for MSH2- needs annual pelvic u/s, CA 125, endometrial bx, urine cytology, colonoscopy and endoscopy  . Lynch syndrome   . Post partum depression     Past Surgical History:  Procedure Laterality Date  . COLONOSCOPY N/A 01/2014, 2017   HX of Lynch Syndrome (genetic)  . DILATION AND EVACUATION N/A 09/02/2017   Procedure: DILATATION AND EVACUATION;  Surgeon: Malachy Mood, MD;  Location: [facility] ORS;  Service: Gynecology;  Laterality: N/A;  . ENDOMETRIAL BIOPSY  2017   normal  . ESOPHAGOGASTRODUODENOSCOPY ENDOSCOPY     Family History:  Family History  Problem Relation Age of Onset  . Cardiomyopathy Maternal Grandmother   . Breast cancer Maternal Grandmother 39  . Stomach cancer Paternal Grandmother 51  . Colon cancer Maternal Grandfather        Lynch syndrome  . Ovarian cancer Maternal Aunt 30        lynch positive  . Uterine cancer Maternal Aunt    Family Psychiatric  History: Family history of anxiety and depression Social History:  Social History   Substance and Sexual Activity  Alcohol Use Yes   Comment: occasional     Social History   Substance and Sexual Activity  Drug Use No    Social History   Socioeconomic History  . Marital status: Married    Spouse name: Not on file  . Number of children: 1  . Years of education: Not on file  . Highest education level: Not on file  Occupational History  . Occupation: Teacher-special education  Social Needs  . Financial resource strain: Not on file  . Food insecurity:    Worry: Not on file    Inability: Not on file  . Transportation needs:    Medical: Not on file    Non-medical: Not on file  Tobacco Use  . Smoking status: Never Smoker  . Smokeless tobacco: Never Used  Substance and Sexual Activity  . Alcohol use: Yes    Comment: occasional  . Drug use: No  . Sexual activity: Yes    Birth control/protection: Condom  Lifestyle  . Physical activity:    Days per week: 5 days    Minutes per session: 40 min  . Stress: Only a little  Relationships  . Social connections:    Talks on phone: Once a week    Gets together: Once a week    Attends  religious service: Never    Active member of club or organization: No    Attends meetings of clubs or organizations: Never    Relationship status: Married  Other Topics Concern  . Not on file  Social History Narrative  . Not on file    Hospital Course: Patient admitted to the hospital.  Cooperative with the evaluation.  Throughout her time here she has absolutely denied any suicidal thoughts and absolutely denied any thoughts of harming her infant.  On interview today she repeats to me what is clear from the chart that the intrusive thought about her infant with her neck broken was ego dystonic and frightening to her.  It was not accompanied by anger irritability or any  thought of acting on it.  In fact all in all it sounds a little bit more like OCD kind of symptom then depression per se although it does sound like she has been having some depressive symptoms for a week or 2.  Patient has been completely calm and appropriate in the hospital.  Cooperative with treatment planning.  No sign of dangerousness.  Patient's husband reported no fears about her coming back home to stay.  At this point patient will be discharged on the Lexapro that was started here.  She can be referred to multiple providers in the community as she has state health insurance.  Encourage patient to follow-up with therapy.  Psychoeducation about her illness completed.  Patient warned that if intrusive thoughts anxiety or depression continue and are disruptive she should immediately call her provider and seek help again.  Physical Findings: AIMS:  , ,  ,  ,    CIWA:    COWS:     Musculoskeletal: Strength & Muscle Tone: within normal limits Gait & Station: normal Patient leans: N/A  Psychiatric Specialty Exam: Physical Exam  Nursing note and vitals reviewed. Constitutional: She appears well-developed and well-nourished.  HENT:  Head: Normocephalic and atraumatic.  Eyes: Pupils are equal, round, and reactive to light. Conjunctivae are normal.  Neck: Normal range of motion.  Cardiovascular: Regular rhythm and normal heart sounds.  Respiratory: Effort normal.  GI: Soft.  Musculoskeletal: Normal range of motion.  Neurological: She is alert.  Skin: Skin is warm and dry.  Psychiatric: She has a normal mood and affect. Her speech is normal and behavior is normal. Judgment and thought content normal. Cognition and memory are normal.    Review of Systems  Constitutional: Negative.   HENT: Negative.   Eyes: Negative.   Respiratory: Negative.   Cardiovascular: Negative.   Gastrointestinal: Negative.   Musculoskeletal: Negative.   Skin: Negative.   Neurological: Negative.    Psychiatric/Behavioral: Negative for depression, hallucinations, memory loss, substance abuse and suicidal ideas. The patient is nervous/anxious. The patient does not have insomnia.     Blood pressure (!) 118/94, pulse 95, temperature 98.1 F (36.7 C), temperature source Oral, resp. rate 18, height '5\' 4"'  (1.626 m), weight 88.5 kg, last menstrual period 01/04/2018, SpO2 100 %, unknown if currently breastfeeding.Body mass index is 33.47 kg/m.  General Appearance: Fairly Groomed  Eye Contact:  Good  Speech:  Clear and Coherent  Volume:  Normal  Mood:  Euthymic  Affect:  Appropriate  Thought Process:  Coherent  Orientation:  Full (Time, Place, and Person)  Thought Content:  Logical  Suicidal Thoughts:  No  Homicidal Thoughts:  No  Memory:  Immediate;   Good Recent;   Fair Remote;   Fair  Judgement:  Fair  Insight:  Fair  Psychomotor Activity:  Normal  Concentration:  Concentration: Fair  Recall:  [employer] of Knowledge:  Fair  Language:  Fair  Akathisia:  No  Handed:  Right  AIMS (if indicated):     Assets:  Communication Skills Desire for Improvement Financial Resources/Insurance Housing Physical Health Resilience Social Support  ADL's:  Intact  Cognition:  WNL  Sleep:  Number of Hours: 8.5     Have you used any form of tobacco in the last 30 days? (Cigarettes, Smokeless Tobacco, Cigars, and/or Pipes): No  Has this patient used any form of tobacco in the last 30 days? (Cigarettes, Smokeless Tobacco, Cigars, and/or Pipes) Yes, No  Blood Alcohol level:  Lab Results  Component Value Date   ETH <10 01/74/9449    Metabolic Disorder Labs:  No results found for: HGBA1C, MPG No results found for: PROLACTIN No results found for: CHOL, TRIG, HDL, CHOLHDL, VLDL, LDLCALC  See Psychiatric Specialty Exam and Suicide Risk Assessment completed by Attending Physician prior to discharge.  Discharge destination:  Home  Is patient on multiple antipsychotic therapies at  discharge:  No   Has Patient had three or more failed trials of antipsychotic monotherapy by history:  No  Recommended Plan for Multiple Antipsychotic Therapies: NA  Discharge Instructions    Diet - low sodium heart healthy   Complete by:  As directed    Increase activity slowly   Complete by:  As directed      Allergies as of 11/02/2018      Reactions   Aloe Vera [aloe] Rash   Penicillins Rash   As infant Has patient had a PCN reaction causing immediate rash, facial/tongue/throat swelling, SOB or lightheadedness with hypotension: Unknown Has patient had a PCN reaction causing severe rash involving mucus membranes or skin necrosis: Unknown Has patient had a PCN reaction that required hospitalization: No Has patient had a PCN reaction occurring within the last 10 years: No If all of the above answers are "NO", then may proceed with Cephalosporin use.      Medication List    STOP taking these medications   acetaminophen 325 MG tablet Commonly known as:  Tylenol   ibuprofen 600 MG tablet Commonly known as:  ADVIL   omeprazole 20 MG capsule Commonly known as:  PRILOSEC   prenatal vitamin w/FE, FA 29-1 MG Chew chewable tablet   sertraline 50 MG tablet Commonly known as:  Zoloft     TAKE these medications     Indication  escitalopram 10 MG tablet Commonly known as:  LEXAPRO Take 1 tablet (10 mg total) by mouth daily. Start taking on:  Nov 03, 2018  Indication:  Major Depressive Disorder      Follow-up Information    [facility] Follow up.   Specialty:  Behavioral Health Why:  A referral for [facility] has been sent. They will contact you directly when they have reviewed your referral. If you have any questions/concerns regarding your referral please contact [facility] directly. Thank You. Contact information: [address] [place] [phone]          Follow-up recommendations:   Activity:  Activity as tolerated Diet:  Regular diet Other:  Follow-up with outpatient treatment in the community continue current medicine as prescribed but be sure to see therapist and outpatient psychiatrist  Comments: See note above.  No sign of acute dangerousness at discharge.  Family agreeable to discharge plan.  Signed: Alethia Berthold, MD 11/02/2018, 2:04 PM

## 2018-11-02 NOTE — BHH Suicide Risk Assessment (Signed)
[facility] Discharge Suicide Risk Assessment   Principal Problem: Postpartum depression Discharge Diagnoses: Principal Problem:   Postpartum depression   Total Time spent with patient: 1 hour  Musculoskeletal: Strength & Muscle Tone: within normal limits Gait & Station: normal Patient leans: N/A  Psychiatric Specialty Exam: Review of Systems  Constitutional: Negative.   HENT: Negative.   Eyes: Negative.   Respiratory: Negative.   Cardiovascular: Negative.   Gastrointestinal: Negative.   Musculoskeletal: Negative.   Skin: Negative.   Neurological: Negative.   Psychiatric/Behavioral: Negative for depression, hallucinations, memory loss, substance abuse and suicidal ideas. The patient is nervous/anxious. The patient does not have insomnia.     Blood pressure (!) 118/94, pulse 95, temperature 98.1 F (36.7 C), temperature source Oral, resp. rate 18, height 5\' 4"  (1.626 m), weight 88.5 kg, last menstrual period 01/04/2018, SpO2 100 %, unknown if currently breastfeeding.Body mass index is 33.47 kg/m.  General Appearance: Casual  Eye Contact::  Fair  Speech:  Normal Rate409  Volume:  Normal  Mood:  Anxious  Affect:  Appropriate  Thought Process:  Goal Directed  Orientation:  Full (Time, Place, and Person)  Thought Content:  Logical  Suicidal Thoughts:  No  Homicidal Thoughts:  No  Memory:  Immediate;   Fair Recent;   Fair Remote;   Fair  Judgement:  Fair  Insight:  Fair  Psychomotor Activity:  Normal  Concentration:  Fair  Recall:  [employer] of Knowledge:Fair  Language: Fair  Akathisia:  No  Handed:  Right  AIMS (if indicated):     Assets:  Desire for Improvement Housing Physical Health Resilience Social Support  Sleep:  Number of Hours: 8.5  Cognition: WNL  ADL's:  Intact   Mental Status Per Nursing Assessment::   On Admission:  Self-harm thoughts  Demographic Factors:  NA  Loss Factors: Loss of some independence and autonomy during the recent coronavirus  crisis.  Historical Factors: NA  Risk Reduction Factors:   Responsible for children under 44 years of age, Sense of responsibility to family, Religious beliefs about death, Employed, Living with another person, especially a relative, Positive social support and Positive therapeutic relationship  Continued Clinical Symptoms:  Depression:   Anhedonia  Cognitive Features That Contribute To Risk:  None    Suicide Risk:  Minimal: No identifiable suicidal ideation.  Patients presenting with no risk factors but with morbid ruminations; may be classified as minimal risk based on the severity of the depressive symptoms  Follow-up Information    [facility] Follow up.   Specialty:  Behavioral Health Why:  A referral for [facility] has been sent. They will contact you directly when they have reviewed your referral. If you have any questions/concerns regarding your referral please contact [facility] directly. Thank You. Contact information: [address] [facility] [place] [address] [phone]          Plan Of Care/Follow-up recommendations:  Activity:  Activity as tolerated Diet:  Regular diet Other:  Continue current medicine but be sure to follow-up with outpatient psychotherapy and psychiatric follow-up.  Mordecai Rasmussen, MD 11/02/2018, 2:01 PM

## 2018-11-02 NOTE — Tx Team (Signed)
Interdisciplinary Treatment and Diagnostic Plan Update  11/02/2018 Time of Session: 230PM Jessica Cardenas MRN: [id number]  Principal Diagnosis: Postpartum depression  Secondary Diagnoses: Principal Problem:   Postpartum depression   Current Medications:  Current Facility-Administered Medications  Medication Dose Route Frequency Provider Last Rate Last Dose  . acetaminophen (TYLENOL) tablet 650 mg  650 mg Oral Q6H PRN Mariel CraftMaurer, Sheila M, MD      . escitalopram (LEXAPRO) tablet 10 mg  10 mg Oral Daily Thalia PartyPaliy, Alisa, MD   10 mg at 11/02/18 0743  . hydrOXYzine (ATARAX/VISTARIL) tablet 25 mg  25 mg Oral Q6H PRN Mariel CraftMaurer, Sheila M, MD       PTA Medications: Medications Prior to Admission  Medication Sig Dispense Refill Last Dose  . sertraline (ZOLOFT) 50 MG tablet Take 1 tablet (50 mg total) by mouth daily. 30 tablet 2 Past Week at Unknown time  . acetaminophen (TYLENOL) 325 MG tablet Take 2 tablets (650 mg total) by mouth every 4 (four) hours as needed for mild pain or moderate pain (for pain scale < 4).   prn at prn  . ibuprofen (ADVIL,MOTRIN) 600 MG tablet Take 1 tablet (600 mg total) by mouth every 6 (six) hours. 30 tablet 1 prn at prn  . omeprazole (PRILOSEC) 20 MG capsule Take 1 capsule (20 mg total) by mouth daily. 30 capsule 11 prn at prn  . prenatal vitamin w/FE, FA (NATACHEW) 29-1 MG CHEW chewable tablet Chew 1 tablet by mouth daily at 12 noon.   Past Month at Unknown time    Patient Stressors:    Patient Strengths:    Treatment Modalities: Medication Management, Group therapy, Case management,  1 to 1 session with clinician, Psychoeducation, Recreational therapy.   Physician Treatment Plan for Primary Diagnosis: Postpartum depression Long Term Goal(s): Improvement in symptoms so as ready for discharge Improvement in symptoms so as ready for discharge   Short Term Goals: Ability to identify changes in lifestyle to reduce recurrence of condition will improve Ability to  verbalize feelings will improve Ability to disclose and discuss suicidal ideas Ability to demonstrate self-control will improve Ability to identify and develop effective coping behaviors will improve Ability to disclose and discuss suicidal ideas Ability to demonstrate self-control will improve Ability to identify and develop effective coping behaviors will improve  Medication Management: Evaluate patient's response, side effects, and tolerance of medication regimen.  Therapeutic Interventions: 1 to 1 sessions, Unit Group sessions and Medication administration.  Evaluation of Outcomes: Adequate for Discharge  Physician Treatment Plan for Secondary Diagnosis: Principal Problem:   Postpartum depression  Long Term Goal(s): Improvement in symptoms so as ready for discharge Improvement in symptoms so as ready for discharge   Short Term Goals: Ability to identify changes in lifestyle to reduce recurrence of condition will improve Ability to verbalize feelings will improve Ability to disclose and discuss suicidal ideas Ability to demonstrate self-control will improve Ability to identify and develop effective coping behaviors will improve Ability to disclose and discuss suicidal ideas Ability to demonstrate self-control will improve Ability to identify and develop effective coping behaviors will improve     Medication Management: Evaluate patient's response, side effects, and tolerance of medication regimen.  Therapeutic Interventions: 1 to 1 sessions, Unit Group sessions and Medication administration.  Evaluation of Outcomes: Adequate for Discharge   RN Treatment Plan for Primary Diagnosis: Postpartum depression Long Term Goal(s): Knowledge of disease and therapeutic regimen to maintain health will improve  Short Term Goals: Ability to demonstrate self-control, Ability to  participate in decision making will improve, Ability to verbalize feelings will improve and Ability to disclose  and discuss suicidal ideas  Medication Management: RN will administer medications as ordered by provider, will assess and evaluate patient's response and provide education to patient for prescribed medication. RN will report any adverse and/or side effects to prescribing provider.  Therapeutic Interventions: 1 on 1 counseling sessions, Psychoeducation, Medication administration, Evaluate responses to treatment, Monitor vital signs and CBGs as ordered, Perform/monitor CIWA, COWS, AIMS and Fall Risk screenings as ordered, Perform wound care treatments as ordered.  Evaluation of Outcomes: Adequate for Discharge   LCSW Treatment Plan for Primary Diagnosis: Postpartum depression Long Term Goal(s): Safe transition to appropriate next level of care at discharge, Engage patient in therapeutic group addressing interpersonal concerns.  Short Term Goals: Engage patient in aftercare planning with referrals and resources, Increase emotional regulation, Facilitate acceptance of mental health diagnosis and concerns and Increase skills for wellness and recovery  Therapeutic Interventions: Assess for all discharge needs, 1 to 1 time with Social worker, Explore available resources and support systems, Assess for adequacy in community support network, Educate family and significant other(s) on suicide prevention, Complete Psychosocial Assessment, Interpersonal group therapy.  Evaluation of Outcomes: Adequate for Discharge   Progress in Treatment: Attending groups: Yes. Participating in groups: Yes. Taking medication as prescribed: Yes. Toleration medication: Yes. Family/Significant other contact made: Yes, individual(s) contacted:  pts husband Patient understands diagnosis: Yes. Discussing patient identified problems/goals with staff: Yes. Medical problems stabilized or resolved: Yes. Denies suicidal/homicidal ideation: Yes. Issues/concerns per patient self-inventory: No. Other: N/A  New problem(s)  identified: No, Describe:  none  New Short Term/Long Term Goal(s): medication management for mood stabilization;  development of comprehensive mental wellness/sobriety plan.   Patient Goals:  "To stop the thoughts I was having"  Discharge Plan or Barriers: SPE pamphlet, Mobile Crisis information, and AA/NA information provided to patient for additional community support and resources. Pt has been referred to [facility] for outpatient therapy.  Reason for Continuation of Hospitalization: none  Estimated Length of Stay: Today 11/02/2018  Attendees: Patient: Jessica Cardenas 11/02/2018 3:35 PM  Physician: Dr Toni Amend MD 11/02/2018 3:35 PM  Nursing:  11/02/2018 3:35 PM  RN Care Manager: 11/02/2018 3:35 PM  Social Worker: Zollie Scale Landan Fedie LCSW 11/02/2018 3:35 PM  Recreational Therapist:  11/02/2018 3:35 PM  Other: Lowella Dandy LCSW 11/02/2018 3:35 PM  Other: Penni Homans LCSW 11/02/2018 3:35 PM  Other: 11/02/2018 3:35 PM    Scribe for Treatment Team: Charlann Lange Shemicka Cohrs, LCSW 11/02/2018 3:35 PM

## 2018-11-02 NOTE — Plan of Care (Signed)
Patient is alert and oriented x 4. Present in the milieu ambulating with a steady gait. Compliant with meals and medications. Denies having any depression, anxiety or thoughts of SI/HI/AVH and pain. Reports that she slept good last night without the use of a sleep aid. Reports that her concentration and appetite is good. Her only goal for today is to be discharged. Breast milk pumped by this Clinical research associate and taken upstairs to mother/baby unit for storage. Will continue to monitor.

## 2018-11-02 NOTE — [facility] Suicide Risk Assessment (Signed)
[facility] INPATIENT:  Family/Significant Other Suicide Prevention Education  Suicide Prevention Education:  Education Completed; Jessica Cardenas, husband [phone] has been identified by the patient as the family member/significant other with whom the patient will be residing, and identified as the person(s) who will aid the patient in the event of a mental health crisis (suicidal ideations/suicide attempt).  With written consent from the patient, the family member/significant other has been provided the following suicide prevention education, prior to the and/or following the discharge of the patient.  The suicide prevention education provided includes the following:  Suicide risk factors  Suicide prevention and interventions  National Suicide Hotline telephone number  [facility] assessment telephone number  [facility] Emergency Assistance 911  [facility] and/or Residential Mobile Crisis Unit telephone number  Request made of family/significant other to:  Remove weapons (e.g., guns, rifles, knives), all items previously/currently identified as safety concern.    Remove drugs/medications (over-the-counter, prescriptions, illicit drugs), all items previously/currently identified as a safety concern.  The family member/significant other verbalizes understanding of the suicide prevention education information provided.  The family member/significant other agrees to remove the items of safety concern listed above.  CSW spoke with Patrecia Pour, pts husband who reported that pts hormones are all over the place and she has not been getting much sleep with the newborn in the house. He stated that pt experienced some post-partum depression 3 years ago after their son was born and is not experiencing it again with their newborn. He reports pt has a history of mild depression and some seasonal depression, but has never been on medication for it. Aneta Mins reports there are guns in  the home and they are locked and secured. Husband denies any SI/HI concerns and no concerns with pt returning home at discharge. Husband asked for an update call when pt has a discharge date.  Charlann Lange Norva Bowe MSW LCSW 11/02/2018, 9:25 AM

## 2018-11-02 NOTE — Progress Notes (Signed)
  [facility] Adult Case Management Discharge Plan :  Will you be returning to the same living situation after discharge:  Yes,  home At discharge, do you have transportation home?: Yes,  husband will pick her up at 4PM Do you have the ability to pay for your medications: Yes,  [employer] insurance  Release of information consent forms completed and in the chart;    Patient to Follow up at: Follow-up Information    [facility] Follow up.   Specialty:  Behavioral Health Why:  A referral for [facility] has been sent. They will contact you directly when they have reviewed your referral. If you have any questions/concerns regarding your referral please contact [facility] directly. Thank You. Contact information: [address] [facility] [place] [address] [phone]          Next level of care provider has access to [facility] Link:yes  Safety Planning and Suicide Prevention discussed: Yes,  SPE completed with pts husband  Have you used any form of tobacco in the last 30 days? (Cigarettes, Smokeless Tobacco, Cigars, and/or Pipes): No  Has patient been referred to the Quitline?: N/A patient is not a smoker  Patient has been referred for addiction treatment: N/A  Mechele Dawley, LCSW 11/02/2018, 1:15 PM

## 2018-11-04 ENCOUNTER — Other Ambulatory Visit: Payer: Self-pay

## 2018-11-04 ENCOUNTER — Encounter: Payer: Self-pay | Admitting: Obstetrics and Gynecology

## 2018-11-04 ENCOUNTER — Ambulatory Visit (INDEPENDENT_AMBULATORY_CARE_PROVIDER_SITE_OTHER): Payer: BC Managed Care – PPO | Admitting: Obstetrics and Gynecology

## 2018-11-04 DIAGNOSIS — F53 Postpartum depression: Secondary | ICD-10-CM

## 2018-11-04 DIAGNOSIS — O99345 Other mental disorders complicating the puerperium: Secondary | ICD-10-CM

## 2018-11-04 NOTE — Progress Notes (Signed)
I connected with Jessica Cardenas on 11/05/18 at  2:10 PM EDT by telephone and verified that I am speaking with the correct person using two identifiers.   I discussed the limitations, risks, security and privacy concerns of performing an evaluation and management service by telephone and the availability of in person appointments. I also discussed with the patient that there may be a patient responsible charge related to this service. The patient expressed understanding and agreed to proceed.  The patient was at home I spoke with the patient from my workstation phone The names of people involved in this encounter were: Jessica Cardenas , and [facility] Visit   Chief Complaint:  Chief Complaint  Patient presents with  . Follow-up    Postpartum depression vaginal delivery 4/13    History of Present Illness: The patient is a 33 y.o. female presenting follow up for symptoms of anxiety and depression.  The patient is currently taking Lexapro for the management of her symptoms. She had been started on Zoloft 56m and initially noted improvement in symptoms.  However, she developed intrusive negative thoughts and self admitted herself.  During admission was switched to lexapro.  Denies any current intrusive thoughts about harm to herself or others.  She reports symptoms of insomnia, irritability, social anxiety and visual hallucinations.  She denies anhedonia, suicidal ideation, homicidal ideation, auditory hallucinations and visual hallucinations  She feels anxiety is her main symptoms at present.. Symptoms have improved since last visit.     The patient does not have a pre-existing history of depression and anxiety.  She  does not a prior history of suicide attempts.  Previous treatment tied include Zoloft (discontinued secondary to development of intrusive thoughts).  Review of Systems: Review of Systems  Constitutional: Positive for malaise/fatigue.  Negative for chills, diaphoresis, fever and weight loss.  Gastrointestinal: Negative for nausea.  Neurological: Negative for headaches.  Psychiatric/Behavioral: Positive for depression. Negative for hallucinations, memory loss, substance abuse and suicidal ideas. The patient is nervous/anxious. The patient does not have insomnia.    Past Medical History:  Past Medical History:  Diagnosis Date  . 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 Lynch syndrome    per [facility] chart positive for MSH2- needs annual pelvic u/s, CA 125, endometrial bx, urine cytology, colonoscopy and endoscopy  . Lynch syndrome   . Post partum depression     Past Surgical History:  Past Surgical History:  Procedure Laterality Date  . COLONOSCOPY N/A 01/2014, 2017   HX of Lynch Syndrome (genetic)  . DILATION AND EVACUATION N/A 09/02/2017   Procedure: DILATATION AND EVACUATION;  Surgeon: SMalachy Mood MD;  Location: [facility] ORS;  Service: Gynecology;  Laterality: N/A;  . ENDOMETRIAL BIOPSY  2017   normal  . ESOPHAGOGASTRODUODENOSCOPY ENDOSCOPY      Gynecologic History: Patient's last menstrual period was 01/04/2018 (exact date).  Obstetric History: [id number] Family History:  Family History  Problem Relation Age of Onset  . Cardiomyopathy Maternal Grandmother   . Breast cancer Maternal Grandmother [phone] . Stomach cancer Paternal Grandmother [phone] . Colon cancer Maternal Grandfather        Lynch syndrome  . Ovarian cancer Maternal Aunt 30       lynch positive  . Uterine cancer Maternal Aunt     Social History:  Social History   Socioeconomic History  . Marital status: Married    Spouse name: Not on file  . Number of  children: 1  . Years of education: Not on file  . Highest education level: Not on file  Occupational History  . Occupation: Teacher-special education  Social Needs  . Financial resource strain: Not on file  . Food insecurity:    Worry: Not on file    Inability: Not on file  . Transportation  needs:    Medical: Not on file    Non-medical: Not on file  Tobacco Use  . Smoking status: Never Smoker  . Smokeless tobacco: Never Used  Substance and Sexual Activity  . Alcohol use: Yes    Comment: occasional  . Drug use: No  . Sexual activity: Yes    Birth control/protection: Condom  Lifestyle  . Physical activity:    Days per week: 5 days    Minutes per session: 40 min  . Stress: Only a little  Relationships  . Social connections:    Talks on phone: Once a week    Gets together: Once a week    Attends religious service: Never    Active member of club or organization: No    Attends meetings of clubs or organizations: Never    Relationship status: Married  . Intimate partner violence:    Fear of current or ex partner: No    Emotionally abused: No    Physically abused: No    Forced sexual activity: No  Other Topics Concern  . Not on file  Social History Narrative  . Not on file    Allergies:  Allergies  Allergen Reactions  . Aloe Vera [Aloe] Rash  . Penicillins Rash    As infant Has patient had a PCN reaction causing immediate rash, facial/tongue/throat swelling, SOB or lightheadedness with hypotension: Unknown Has patient had a PCN reaction causing severe rash involving mucus membranes or skin necrosis: Unknown Has patient had a PCN reaction that required hospitalization: No Has patient had a PCN reaction occurring within the last 10 years: No If all of the above answers are "NO", then may proceed with Cephalosporin use.     Medications: Prior to Admission medications   Medication Sig Start Date End Date Taking? Authorizing Provider  escitalopram (LEXAPRO) 10 MG tablet Take 1 tablet (10 mg total) by mouth daily. 11/03/18  Yes Clapacs, Madie Reno, MD    Physical Exam Vitals: There were no vitals filed for this visit. Patient's last menstrual period was 01/04/2018 (exact date).  No physical exam as this was a remote telephone visit to promote social distancing  during the current COVID-19 Pandemic   Edinburgh Postnatal Depression Scale - 11/04/18 1425      Edinburgh Postnatal Depression Scale:  In the Past 7 Days   I have been able to laugh and see the funny side of things.  0    I have looked forward with enjoyment to things.  1    I have blamed myself unnecessarily when things went wrong.  2    I have been anxious or worried for no good reason.  2    I have felt scared or panicky for no good reason.  1    Things have been getting on top of me.  2    I have been so unhappy that I have had difficulty sleeping.  0    I have felt sad or miserable.  0   anxious   I have been so unhappy that I have been crying.  1    The thought of harming myself has occurred  to me.  1    Edinburgh Postnatal Depression Scale Total  10       No flowsheet data found.  No flowsheet data found.  No flowsheet data found.   Assessment: 33 y.o. [id number] follow up for postpartum depression  Plan: Problem List Items Addressed This Visit    None      1) Postpartum depression - continue lexapro at 40m po daily.  Follow up in 1 week.  Fatigue is main side-effect noted by patient thus far.  If improving and depending on scaling will consider increasing lexapro to 265mpo daily  2) Thyroid and B12 screen has not been obtained previously  3) Telephone Time: 16:42  4) Return in about 4 weeks (around 12/02/2018) for medication follow up.   AnMalachy MoodMD, FALoura PardonB/GYN, [facility] 11/04/2018, 2:56 PM

## 2018-11-12 ENCOUNTER — Other Ambulatory Visit: Payer: Self-pay

## 2018-11-12 ENCOUNTER — Ambulatory Visit (INDEPENDENT_AMBULATORY_CARE_PROVIDER_SITE_OTHER): Payer: BC Managed Care – PPO | Admitting: Obstetrics and Gynecology

## 2018-11-12 DIAGNOSIS — F53 Postpartum depression: Secondary | ICD-10-CM

## 2018-11-12 DIAGNOSIS — O99345 Other mental disorders complicating the puerperium: Secondary | ICD-10-CM

## 2018-11-12 MED ORDER — ESCITALOPRAM OXALATE 20 MG PO TABS: 20.0000 mg | ORAL_TABLET | Freq: Every day | ORAL | 4 refills | Status: DC

## 2018-11-12 NOTE — Progress Notes (Signed)
Obstetrics & Gynecology Office Visit   Chief Complaint:  Chief Complaint  Patient presents with  . Follow-up    medication follow up    History of Present Illness: The patient is a 33 y.o. female presenting follow up for symptoms of anxiety and depression.  The patient is currently taking Lexparo 10m for the management of her symptoms.  She has not had any recent situational stressors.  She reports symptoms of still some minor residual anxiety but significant improvement .  She denies anhedonia, day time somnolence, insomnia, risk taking behavior, irritability, increased appetite, decreased appetite, social anxiety, agorophobia, feelings of guilt, feelings of worthlessness, suicidal ideation, homicidal ideation, auditory hallucinations and visual hallucinations. Symptoms have improved since last visit.     Improvement in fatigue  Review of Systems: Review of Systems  Constitutional: Negative.   Gastrointestinal: Negative for nausea.  Neurological: Negative for headaches.  Psychiatric/Behavioral: Negative for depression, hallucinations, memory loss, substance abuse and suicidal ideas. The patient is nervous/anxious. The patient does not have insomnia.      Past Medical History:  Past Medical History:  Diagnosis Date  . 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 Lynch syndrome    per [facility] chart positive for MSH2- needs annual pelvic u/s, CA 125, endometrial bx, urine cytology, colonoscopy and endoscopy  . Lynch syndrome   . Post partum depression     Past Surgical History:  Past Surgical History:  Procedure Laterality Date  . COLONOSCOPY N/A 01/2014, 2017   HX of Lynch Syndrome (genetic)  . DILATION AND EVACUATION N/A 09/02/2017   Procedure: DILATATION AND EVACUATION;  Surgeon: SMalachy Mood MD;  Location: [facility] ORS;  Service: Gynecology;  Laterality: N/A;  . ENDOMETRIAL BIOPSY  2017   normal  . ESOPHAGOGASTRODUODENOSCOPY ENDOSCOPY      Gynecologic History: Patient's last  menstrual period was 01/04/2018 (exact date).  Obstetric History: [id number] Family History:  Family History  Problem Relation Age of Onset  . Cardiomyopathy Maternal Grandmother   . Breast cancer Maternal Grandmother 473 . Stomach cancer Paternal Grandmother 732 . Colon cancer Maternal Grandfather        Lynch syndrome  . Ovarian cancer Maternal Aunt 30       lynch positive  . Uterine cancer Maternal Aunt     Social History:  Social History   Socioeconomic History  . Marital status: Married    Spouse name: Not on file  . Number of children: 1  . Years of education: Not on file  . Highest education level: Not on file  Occupational History  . Occupation: Teacher-special education  Social Needs  . Financial resource strain: Not on file  . Food insecurity:    Worry: Not on file    Inability: Not on file  . Transportation needs:    Medical: Not on file    Non-medical: Not on file  Tobacco Use  . Smoking status: Never Smoker  . Smokeless tobacco: Never Used  Substance and Sexual Activity  . Alcohol use: Yes    Comment: occasional  . Drug use: No  . Sexual activity: Yes    Birth control/protection: Condom  Lifestyle  . Physical activity:    Days per week: 5 days    Minutes per session: 40 min  . Stress: Only a little  Relationships  . Social connections:    Talks on phone: Once a week    Gets together: Once a week    Attends  religious service: Never    Active member of club or organization: No    Attends meetings of clubs or organizations: Never    Relationship status: Married  . Intimate partner violence:    Fear of current or ex partner: No    Emotionally abused: No    Physically abused: No    Forced sexual activity: No  Other Topics Concern  . Not on file  Social History Narrative  . Not on file    Allergies:  Allergies  Allergen Reactions  . Aloe Vera [Aloe] Rash  . Penicillins Rash    As infant Has patient had a PCN reaction causing immediate  rash, facial/tongue/throat swelling, SOB or lightheadedness with hypotension: Unknown Has patient had a PCN reaction causing severe rash involving mucus membranes or skin necrosis: Unknown Has patient had a PCN reaction that required hospitalization: No Has patient had a PCN reaction occurring within the last 10 years: No If all of the above answers are "NO", then may proceed with Cephalosporin use.     Medications: Prior to Admission medications   Medication Sig Start Date End Date Taking? Authorizing Provider  escitalopram (LEXAPRO) 10 MG tablet Take 1 tablet (10 mg total) by mouth daily. 11/03/18   Clapacs, Madie Reno, MD    Physical Exam Vitals: There were no vitals filed for this visit. Patient's last menstrual period was 01/04/2018 (exact date).  General: NAD HEENT: normocephalic, anicteric Pulmonary: No increased work of breathing Neurologic: Grossly intact Psychiatric: mood appropriate, affect full  Edinburgh Postnatal Depression Scale - 11/12/18 1505      Edinburgh Postnatal Depression Scale:  In the Past 7 Days   I have been able to laugh and see the funny side of things.  0    I have looked forward with enjoyment to things.  0    I have blamed myself unnecessarily when things went wrong.  1    I have felt scared or panicky for no good reason.  1    Things have been getting on top of me.  1    I have been so unhappy that I have had difficulty sleeping.  0    I have felt sad or miserable.  1    I have been so unhappy that I have been crying.  0    The thought of harming myself has occurred to me.  0      Edinburgh Postnatal Depression Scale - 11/12/18 1505      Edinburgh Postnatal Depression Scale:  In the Past 7 Days   I have been able to laugh and see the funny side of things.  0    I have looked forward with enjoyment to things.  0    I have blamed myself unnecessarily when things went wrong.  1    I have felt scared or panicky for no good reason.  1    Things have  been getting on top of me.  1    I have been so unhappy that I have had difficulty sleeping.  0    I have felt sad or miserable.  1    I have been so unhappy that I have been crying.  0    The thought of harming myself has occurred to me.  0        Assessment: 33 y.o. [id number]  Plan: Problem List Items Addressed This Visit      Other   Postpartum care following vaginal delivery -  Primary   Postpartum depression   Relevant Medications   escitalopram (LEXAPRO) 20 MG tablet      1) Anxiety/Depression - very good improvement to date.  Notes improvement in fatigue/energy.  No intrusive thoughts.  Will increase lexapro to 4m po daily  2) Thyroid and B12 screen has not been obtained previously  3) Telephone time 10 minutes 42 seconds  4) Return in about 2 weeks (around 11/26/2018) for medication follow up phone.   AMalachy Mood MD, [facility] 11/12/2018, 3:04 PM

## 2018-11-13 ENCOUNTER — Ambulatory Visit (INDEPENDENT_AMBULATORY_CARE_PROVIDER_SITE_OTHER): Payer: BC Managed Care – PPO | Admitting: Maternal Newborn

## 2018-11-13 ENCOUNTER — Encounter: Payer: Self-pay | Admitting: Maternal Newborn

## 2018-11-13 NOTE — Progress Notes (Signed)
Postpartum Visit  Chief Complaint:  Chief Complaint  Patient presents with  . Postpartum Care    History of Present Illness: Patient is a 33 y.o. [id number] presenting for a postpartum visit.   Review the Delivery Report for details.  Date of delivery:  Information for the patient's newborn:  Phenix, Grein [[id number]]  10/05/2018  Type of delivery: Vaginal delivery - Vacuum or forceps assisted  no Episiotomy: No  Laceration: yes  Pregnancy or labor problems:  no Any problems since the delivery:  Yes, postpartum anxiety and depression, has improved with medication and now mild anxiety only  Newborn Details:  SINGLETON   Gender: Female  Birth weight:  Information for the patient's newborn:  Naliyah, Neth [[id number]]  6 lb 12.3 oz (3.07 kg)  Maternal Details:  Breast Feeding:  Yes, pumping and bottle feeding Post partum depression/anxiety noted:  Yes, improving, see note from 11/12/2018 [place] Post-Partum Depression Score:  6  Date of last PAP: 05/27/2017 NILM  Review of Systems  Constitutional: Negative.   Eyes: Negative.   Respiratory: Negative for shortness of breath and wheezing.   Cardiovascular: Negative for chest pain and palpitations.  Gastrointestinal: Negative.   Genitourinary: Negative.   Musculoskeletal: Negative.   Skin: Negative.   Neurological: Negative.   Endo/Heme/Allergies: Negative.   Psychiatric/Behavioral: The patient is nervous/anxious.     Past Medical History:  Past Medical History:  Diagnosis Date  . 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 Lynch syndrome    per [facility] chart positive for MSH2- needs annual pelvic u/s, CA 125, endometrial bx, urine cytology, colonoscopy and endoscopy  . Lynch syndrome   . Post partum depression     Past Surgical History:  Past Surgical History:  Procedure Laterality Date  . COLONOSCOPY N/A 01/2014, 2017   HX of Lynch Syndrome (genetic)  . DILATION AND EVACUATION N/A 09/02/2017    Procedure: DILATATION AND EVACUATION;  Surgeon: Malachy Mood, MD;  Location: [facility] ORS;  Service: Gynecology;  Laterality: N/A;  . ENDOMETRIAL BIOPSY  2017   normal  . ESOPHAGOGASTRODUODENOSCOPY ENDOSCOPY      Family History:  Family History  Problem Relation Age of Onset  . Cardiomyopathy Maternal Grandmother   . Breast cancer Maternal Grandmother 71  . Stomach cancer Paternal Grandmother 31  . Colon cancer Maternal Grandfather        Lynch syndrome  . Ovarian cancer Maternal Aunt 30       lynch positive  . Uterine cancer Maternal Aunt     Social History:  Social History   Socioeconomic History  . Marital status: Married    Spouse name: Not on file  . Number of children: 1  . Years of education: Not on file  . Highest education level: Not on file  Occupational History  . Occupation: Teacher-special education  Social Needs  . Financial resource strain: Not on file  . Food insecurity:    Worry: Not on file    Inability: Not on file  . Transportation needs:    Medical: Not on file    Non-medical: Not on file  Tobacco Use  . Smoking status: Never Smoker  . Smokeless tobacco: Never Used  Substance and Sexual Activity  . Alcohol use: Not Currently    Comment: occasional  . Drug use: Never  . Sexual activity: Not Currently    Birth control/protection: None  Lifestyle  . Physical activity:    Days per week: 5 days  Minutes per session: 40 min  . Stress: Only a little  Relationships  . Social connections:    Talks on phone: Once a week    Gets together: Once a week    Attends religious service: Never    Active member of club or organization: No    Attends meetings of clubs or organizations: Never    Relationship status: Married  . Intimate partner violence:    Fear of current or ex partner: No    Emotionally abused: No    Physically abused: No    Forced sexual activity: No  Other Topics Concern  . Not on file  Social History Narrative  . Not on file     Allergies:  Allergies  Allergen Reactions  . Aloe Vera [Aloe] Rash  . Penicillins Rash    As infant Has patient had a PCN reaction causing immediate rash, facial/tongue/throat swelling, SOB or lightheadedness with hypotension: Unknown Has patient had a PCN reaction causing severe rash involving mucus membranes or skin necrosis: Unknown Has patient had a PCN reaction that required hospitalization: No Has patient had a PCN reaction occurring within the last 10 years: No If all of the above answers are "NO", then may proceed with Cephalosporin use.     Medications: Prior to Admission medications   Medication Sig Start Date End Date Taking? Authorizing Provider  escitalopram (LEXAPRO) 20 MG tablet Take 1 tablet (20 mg total) by mouth daily. 11/12/18  Yes Malachy Mood, MD    Physical Exam Vitals:  Vitals:   11/13/18 1007  BP: 110/70    General: NAD HEENT: normocephalic, anicteric Pulmonary: No increased work of breathing, CTAB Cardiovascular: RRR, no murmurs, rubs, or gallops Abdomen: Soft, non-tender, non-distended.  Umbilicus without lesions.  No hepatomegaly, splenomegaly or masses palpable. No evidence of hernia Genitourinary:  External: Normal external female genitalia.  Normal urethral meatus, normal  Bartholin's and Skene's glands.    Vagina: Normal vaginal mucosa, no evidence of prolapse.    Cervix: Grossly normal in appearance, no bleeding  Uterus: Non-enlarged, mobile, normal contour.  No CMT  Adnexa: ovaries non-enlarged, no adnexal masses  Rectal: deferred Extremities: no edema, erythema, or tenderness Neurologic: Grossly intact Psychiatric: mood appropriate, affect full  Assessment: 33 y.o. [id number] presenting for a 6 week postpartum visit.  Plan: Problem List Items Addressed This Visit    None    Visit Diagnoses    Postpartum examination following vaginal delivery    -  Primary       1) Contraception: Currently using condoms, may consider other  options when finished breastfeeding.  2)  Pap - ASCCP guidelines and rational discussed.  Patient opts for every three year screening interval (or with next preventative biopsy).  3) Patient underwent screening for postpartum depression with no concerns noted, anxiety still present but improving.  4) Keep scheduled medication follow up visit with Dr. Georgianne Fick in two weeks.  Avel Sensor, CNM 11/13/2018

## 2018-11-20 ENCOUNTER — Encounter: Payer: Self-pay | Admitting: Maternal Newborn

## 2018-11-25 NOTE — Progress Notes (Signed)
I connected with Jessica Cardenas on 11/27/18 at  4:10 PM EDT by telephone and verified that I am speaking with the correct person using two identifiers.   I discussed the limitations, risks, security and privacy concerns of performing an evaluation and management service by telephone and the availability of in person appointments. I also discussed with the patient that there may be a patient responsible charge related to this service. The patient expressed understanding and agreed to proceed.  The patient was at home I spoke with the patient from my workstation phone The names of people involved in this encounter were: Jessica Cardenas , and [facility] Office Visit   Chief Complaint:  Chief Complaint  Patient presents with  . Follow-up    Postpartum depression    History of Present Illness: The patient is a 33 y.o. female presenting follow up for symptoms of anxiety and depression.  The patient is currently taking Lexapro 29m for the management of her symptoms.  She has had any recent situational stressors, child birth, recent inpatient admission for intrusive thoughts on Zoloft.  She reports complete resolution of symptoms.  She denies anhedonia, day time somnolence, insomnia, risk taking behavior, irritability, social anxiety, agorophobia, feelings of guilt, feelings of worthlessness, suicidal ideation, homicidal ideation, auditory hallucinations and visual hallucinations. Symptoms have improved since last visit.     The patient does not have a pre-existing history of depression and anxiety.  She  does not a prior history of suicide attempts.  Previous treatment tied include Zoloft (intrusive thoughts)  Review of Systems: Review of Systems  Cardiovascular: Negative for palpitations.  Gastrointestinal: Negative for nausea and vomiting.  Neurological: Negative for headaches.  Psychiatric/Behavioral: Negative.     Past Medical History:  Past  Medical History:  Diagnosis Date  . 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 Lynch syndrome    per [facility] chart positive for MSH2- needs annual pelvic u/s, CA 125, endometrial bx, urine cytology, colonoscopy and endoscopy  . Lynch syndrome   . Post partum depression     Past Surgical History:  Past Surgical History:  Procedure Laterality Date  . COLONOSCOPY N/A 01/2014, 2017   HX of Lynch Syndrome (genetic)  . DILATION AND EVACUATION N/A 09/02/2017   Procedure: DILATATION AND EVACUATION;  Surgeon: SMalachy Mood MD;  Location: [facility] ORS;  Service: Gynecology;  Laterality: N/A;  . ENDOMETRIAL BIOPSY  2017   normal  . ESOPHAGOGASTRODUODENOSCOPY ENDOSCOPY      Gynecologic History: No LMP recorded.  Obstetric History: [id number] Family History:  Family History  Problem Relation Age of Onset  . Cardiomyopathy Maternal Grandmother   . Breast cancer Maternal Grandmother 466 . Stomach cancer Paternal Grandmother 772 . Colon cancer Maternal Grandfather        Lynch syndrome  . Ovarian cancer Maternal Aunt 30       lynch positive  . Uterine cancer Maternal Aunt     Social History:  Social History   Socioeconomic History  . Marital status: Married    Spouse name: Not on file  . Number of children: 1  . Years of education: Not on file  . Highest education level: Not on file  Occupational History  . Occupation: Teacher-special education  Social Needs  . Financial resource strain: Not on file  . Food insecurity:    Worry: Not on file    Inability: Not on file  . Transportation needs:  Medical: Not on file    Non-medical: Not on file  Tobacco Use  . Smoking status: Never Smoker  . Smokeless tobacco: Never Used  Substance and Sexual Activity  . Alcohol use: Not Currently    Comment: occasional  . Drug use: Never  . Sexual activity: Not Currently    Birth control/protection: None  Lifestyle  . Physical activity:    Days per week: 5 days    Minutes per session: 40 min  .  Stress: Only a little  Relationships  . Social connections:    Talks on phone: Once a week    Gets together: Once a week    Attends religious service: Never    Active member of club or organization: No    Attends meetings of clubs or organizations: Never    Relationship status: Married  . Intimate partner violence:    Fear of current or ex partner: No    Emotionally abused: No    Physically abused: No    Forced sexual activity: No  Other Topics Concern  . Not on file  Social History Narrative  . Not on file    Allergies:  Allergies  Allergen Reactions  . Aloe Vera [Aloe] Rash  . Penicillins Rash    As infant Has patient had a PCN reaction causing immediate rash, facial/tongue/throat swelling, SOB or lightheadedness with hypotension: Unknown Has patient had a PCN reaction causing severe rash involving mucus membranes or skin necrosis: Unknown Has patient had a PCN reaction that required hospitalization: No Has patient had a PCN reaction occurring within the last 10 years: No If all of the above answers are "NO", then may proceed with Cephalosporin use.     Medications: Prior to Admission medications   Medication Sig Start Date End Date Taking? Authorizing Provider  escitalopram (LEXAPRO) 20 MG tablet Take 1 tablet (20 mg total) by mouth daily. 11/12/18   Malachy Mood, MD    Physical Exam Vitals: There were no vitals filed for this visit. No LMP recorded.  No physical exam as this was a remote telephone visit to promote social distancing during the current COVID-19 Pandemic   Edinburgh Postnatal Depression Scale - 11/26/18 1610      Edinburgh Postnatal Depression Scale:  In the Past 7 Days   I have been able to laugh and see the funny side of things.  0    I have looked forward with enjoyment to things.  0    I have blamed myself unnecessarily when things went wrong.  1    I have been anxious or worried for no good reason.  0    I have felt scared or panicky  for no good reason.  0    Things have been getting on top of me.  1    I have been so unhappy that I have had difficulty sleeping.  0    I have felt sad or miserable.  0    I have been so unhappy that I have been crying.  0    The thought of harming myself has occurred to me.  0    Edinburgh Postnatal Depression Scale Total  2       No flowsheet data found.  No flowsheet data found.  No flowsheet data found.   Assessment: 33 y.o. [id number] follow up postpartum depression  Plan: Problem List Items Addressed This Visit      Other   Postpartum depression - Primary  1) Continue Lexapro at 94m dose  2) Thyroid and B12 screen has not been obtained previously  3) Restart lynch syndrome screening in 6 months  4) Telephone time 9:238m  5) Return in about 6 months (around 05/28/2019) for endometrial biopsy.    AnMalachy MoodMD, F[facility]oral [facility]ace[facility]N, [facility] 11/26/2018, 4:12 PM

## 2018-11-26 ENCOUNTER — Ambulatory Visit (INDEPENDENT_AMBULATORY_CARE_PROVIDER_SITE_OTHER): Payer: BC Managed Care – PPO | Admitting: Obstetrics and Gynecology

## 2018-11-26 ENCOUNTER — Other Ambulatory Visit: Payer: Self-pay

## 2018-11-26 ENCOUNTER — Encounter: Payer: Self-pay | Admitting: Obstetrics and Gynecology

## 2018-11-26 DIAGNOSIS — F53 Postpartum depression: Secondary | ICD-10-CM | POA: Diagnosis not present

## 2018-11-26 DIAGNOSIS — O99345 Other mental disorders complicating the puerperium: Secondary | ICD-10-CM | POA: Diagnosis not present

## 2018-12-05 ENCOUNTER — Other Ambulatory Visit: Payer: Self-pay | Admitting: Obstetrics and Gynecology

## 2018-12-15 ENCOUNTER — Telehealth: Payer: Self-pay | Admitting: Certified Nurse Midwife

## 2018-12-15 MED ORDER — NORETHIN ACE-ETH ESTRAD-FE 1-20 MG-MCG(24) PO TABS: 1.0000 | ORAL_TABLET | Freq: Every day | ORAL | 10 refills | Status: DC

## 2018-12-17 NOTE — Telephone Encounter (Signed)
Sent in RX for OCPs. Dalia Heading, CNM

## 2019-09-20 ENCOUNTER — Other Ambulatory Visit: Payer: Self-pay

## 2019-09-20 ENCOUNTER — Ambulatory Visit (INDEPENDENT_AMBULATORY_CARE_PROVIDER_SITE_OTHER): Payer: BC Managed Care – PPO | Admitting: Obstetrics and Gynecology

## 2019-09-20 ENCOUNTER — Encounter: Payer: Self-pay | Admitting: Obstetrics and Gynecology

## 2019-09-20 VITALS — Wt 191.0 lb

## 2019-09-20 DIAGNOSIS — Z148 Genetic carrier of other disease: Secondary | ICD-10-CM | POA: Diagnosis not present

## 2019-09-20 DIAGNOSIS — F419 Anxiety disorder, unspecified: Secondary | ICD-10-CM | POA: Diagnosis not present

## 2019-09-20 DIAGNOSIS — F32A Depression, unspecified: Secondary | ICD-10-CM

## 2019-09-20 DIAGNOSIS — F329 Major depressive disorder, single episode, unspecified: Secondary | ICD-10-CM | POA: Diagnosis not present

## 2019-09-20 MED ORDER — JUNEL FE 24 1-20 MG-MCG(24) PO TABS: 1.0000 | ORAL_TABLET | Freq: Every day | ORAL | 10 refills | Status: DC

## 2019-09-20 MED ORDER — ESCITALOPRAM OXALATE 20 MG PO TABS: 20.0000 mg | ORAL_TABLET | Freq: Every day | ORAL | 3 refills | Status: DC

## 2019-09-20 NOTE — Progress Notes (Signed)
I connected with Jessica Cardenas on 09/21/19 at  4:30 PM EDT by telephone and verified that I am speaking with the correct person using two identifiers.   I discussed the limitations, risks, security and privacy concerns of performing an evaluation and management service by telephone and the availability of in person appointments. I also discussed with the patient that there may be a patient responsible charge related to this service. The patient expressed understanding and agreed to proceed.  The patient was at home I spoke with the patient from my workstation phone The names of people involved in this encounter were: Jessica Cardenas , and [facility] Visit   Chief Complaint:  Chief Complaint  Patient presents with  . Follow-up    Depression     History of Present Illness: The patient is a 34 y.o. female presenting follow up for symptoms of anxiety and depression.  The patient is currently taking Lexapro 59m for the management of her symptoms.  She has not had any recent situational stressors.  She reports continued good control on her current symptoms.  She denies anhedonia, day time somnolence, insomnia, risk taking behavior, irritability, social anxiety, agorophobia, feelings of guilt, feelings of worthlessness, suicidal ideation, homicidal ideation, auditory hallucinations and visual hallucinations. Symptoms have remained unchanged since last visit.     The patient does have a pre-existing history of depression and anxiety.  She  does not a prior history of suicide attempts.    Review of Systems: review of systems negative unless otherwise noted in HPI  Past Medical History:  Past Medical History:  Diagnosis Date  . 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 Lynch syndrome    per [facility] chart positive for MSH2- needs annual pelvic u/s, CA 125, endometrial bx, urine cytology, colonoscopy and endoscopy  . Lynch syndrome   . Post partum depression      Past Surgical History:  Past Surgical History:  Procedure Laterality Date  . COLONOSCOPY N/A 01/2014, 2017   HX of Lynch Syndrome (genetic)  . DILATION AND EVACUATION N/A 09/02/2017   Procedure: DILATATION AND EVACUATION;  Surgeon: SMalachy Mood MD;  Location: [facility] ORS;  Service: Gynecology;  Laterality: N/A;  . ENDOMETRIAL BIOPSY  2017   normal  . ESOPHAGOGASTRODUODENOSCOPY ENDOSCOPY      Gynecologic History: Patient's last menstrual period was 09/06/2019 (exact date).  Obstetric History: [id number] Family History:  Family History  Problem Relation Age of Onset  . Cardiomyopathy Maternal Grandmother   . Breast cancer Maternal Grandmother 428 . Stomach cancer Paternal Grandmother 734 . Colon cancer Maternal Grandfather        Lynch syndrome  . Ovarian cancer Maternal Aunt 30       lynch positive  . Uterine cancer Maternal Aunt     Social History:  Social History   Socioeconomic History  . Marital status: Married    Spouse name: Not on file  . Number of children: 1  . Years of education: Not on file  . Highest education level: Not on file  Occupational History  . Occupation: Teacher-special education  Tobacco Use  . Smoking status: Never Smoker  . Smokeless tobacco: Never Used  Substance and Sexual Activity  . Alcohol use: Not Currently    Comment: occasional  . Drug use: Never  . Sexual activity: Yes    Birth control/protection: Pill  Other Topics Concern  . Not on file  Social History Narrative  .  Not on file   Social Determinants of Health   Financial Resource Strain:   . Difficulty of Paying Living Expenses:   Food Insecurity:   . Worried About Charity fundraiser in the Last Year:   . Arboriculturist in the Last Year:   Transportation Needs:   . Film/video editor (Medical):   Marland Kitchen Lack of Transportation (Non-Medical):   Physical Activity:   . Days of Exercise per Week:   . Minutes of Exercise per Session:   Stress:   . Feeling of  Stress :   Social Connections:   . Frequency of Communication with Friends and Family:   . Frequency of Social Gatherings with Friends and Family:   . Attends Religious Services:   . Active Member of Clubs or Organizations:   . Attends Archivist Meetings:   Marland Kitchen Marital Status:   Intimate Partner Violence:   . Fear of Current or Ex-Partner:   . Emotionally Abused:   Marland Kitchen Physically Abused:   . Sexually Abused:     Allergies:  Allergies  Allergen Reactions  . Aloe Vera [Aloe] Rash  . Penicillins Rash    As infant Has patient had a PCN reaction causing immediate rash, facial/tongue/throat swelling, SOB or lightheadedness with hypotension: Unknown Has patient had a PCN reaction causing severe rash involving mucus membranes or skin necrosis: Unknown Has patient had a PCN reaction that required hospitalization: No Has patient had a PCN reaction occurring within the last 10 years: No If all of the above answers are "NO", then may proceed with Cephalosporin use.     Medications: Prior to Admission medications   Medication Sig Start Date End Date Taking? Authorizing Provider  escitalopram (LEXAPRO) 20 MG tablet TAKE 1 TABLET BY MOUTH EVERY DAY 12/06/18  Yes Malachy Mood, MD  Norethindrone Acetate-Ethinyl Estrad-FE (JUNEL FE 24) 1-20 MG-MCG(24) tablet Take 1 tablet by mouth daily. 12/15/18  Yes Dalia Heading, CNM    Physical Exam Vitals: There were no vitals filed for this visit. Patient's last menstrual period was 09/06/2019 (exact date).  No physical exam as this was a remote telephone visit to promote social distancing during the current COVID-19 Pandemic    GAD 7 : Generalized Anxiety Score 09/20/2019  Nervous, Anxious, on Edge 2  Control/stop worrying 1  Worry too much - different things 1  Trouble relaxing 1  Restless 0  Easily annoyed or irritable 1  Afraid - awful might happen 0  Total GAD 7 Score 6  Anxiety Difficulty Not difficult at all     Depression screen [facility] 2/9 09/20/2019  Decreased Interest 0  Down, Depressed, Hopeless 1  PHQ - 2 Score 1  Altered sleeping 1  Tired, decreased energy 1  Change in appetite 0  Feeling bad or failure about yourself  1  Trouble concentrating 1  Moving slowly or fidgety/restless 1  Suicidal thoughts 0  PHQ-9 Score 6  Difficult doing work/chores Not difficult at all    Depression screen [facility] 2/9 09/20/2019  Decreased Interest 0  Down, Depressed, Hopeless 1  PHQ - 2 Score 1  Altered sleeping 1  Tired, decreased energy 1  Change in appetite 0  Feeling bad or failure about yourself  1  Trouble concentrating 1  Moving slowly or fidgety/restless 1  Suicidal thoughts 0  PHQ-9 Score 6  Difficult doing work/chores Not difficult at all     Assessment: 34 y.o. 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  NCCN guidelines checked.  No clear consensus on timing of hysterectomy but increased lifetime risk of both endometrial and ovarian cancer.  Average age of onset for ovarian cancer 19 with incidence of 8-38% of patient, average age of onset for endometrial cancer 47-48% with incidence of 21-57%.  Currently no surveillance for either has been recommended or found to be effective.  Timing of hysterectomy +/- BSO is individualized based on patient preferrance and completion of child bearing.  As Rosann does not with to conceive in the future hysterectomy would be reasonable to pursue at any point.  Until such time continue OCP use.  2) Refill on Lexapro stable on current dose  3) Refill OCP   4) Telephone time 16:12 minutes  5)  Return in about 1 year (around 09/19/2020) for annual.    Malachy Mood, MD, [place] 09/20/2019, 5:26 PM

## 2019-09-21 DIAGNOSIS — Z148 Genetic carrier of other disease: Secondary | ICD-10-CM | POA: Insufficient documentation

## 2019-09-22 ENCOUNTER — Encounter: Payer: Self-pay | Admitting: Obstetrics and Gynecology

## 2019-09-22 DIAGNOSIS — Z8041 Family history of malignant neoplasm of ovary: Secondary | ICD-10-CM | POA: Insufficient documentation

## 2019-09-27 ENCOUNTER — Telehealth: Payer: Self-pay | Admitting: Obstetrics and Gynecology

## 2019-09-27 NOTE — Telephone Encounter (Signed)
Called pt to sch surgery w Dr Bonney Aid  DOS 6/8 Outpt in bed (observe)  H&P & Endo Bx 5/28 @ 3:50  Covid 6/4 @ 8-10:30, [address], drive up and wear mask. Adv pt to quar after test until DOS  Adv pt that she will see on MyChart a pre-admit appt. May also receive calls from hosp pharmacy and pre-service ctr  [facility] and no secondary

## 2019-09-27 NOTE — Telephone Encounter (Signed)
-----   Message from Vena Austria, MD sent at 09/22/2019 10:48 AM EDT ----- Regarding: Surgery Surgery Booking Request Patient Full Name:  Jessica Cardenas  MRN: [id number]  DOB: 22-Aug-1985  Surgeon: Vena Austria, MD  Requested Surgery Date and Time: Sometime after 11/29/19 Primary Diagnosis AND Code: Lynch syndrome Z15.09 Secondary Diagnosis and Code: Family history of ovarian cancer Z80.41 Surgical Procedure: total laparoscopic hysterectomy, bilateral salpingo-oophorectomy, and cystoscopy L&D Notification: No Admission Status: observation Length of Surgery: 2.5hrs Special Case Needs: No H&P: Yes Phone Interview???:  No Interpreter: No Language:  Medical Clearance:  No Special Scheduling Instructions: H&P 1 week prior to obtain endometrial biopsy Any known health/anesthesia issues, diabetes, sleep apnea, latex allergy, defibrillator/pacemaker?: No Acuity: P3   (P1 highest, P2 delay may cause harm, P3 low, elective gyn, P4 lowest)

## 2019-11-19 ENCOUNTER — Ambulatory Visit (INDEPENDENT_AMBULATORY_CARE_PROVIDER_SITE_OTHER): Payer: BC Managed Care – PPO | Admitting: Obstetrics and Gynecology

## 2019-11-19 ENCOUNTER — Encounter: Payer: Self-pay | Admitting: Obstetrics and Gynecology

## 2019-11-19 ENCOUNTER — Other Ambulatory Visit (HOSPITAL_COMMUNITY)
Admission: RE | Admit: 2019-11-19 | Discharge: 2019-11-19 | Disposition: A | Discharge status: Home / Self Care | Payer: BC Managed Care – PPO | Source: Ambulatory Visit | Attending: Obstetrics and Gynecology | Admitting: Obstetrics and Gynecology

## 2019-11-19 ENCOUNTER — Other Ambulatory Visit: Payer: Self-pay

## 2019-11-19 VITALS — BP 112/72 | HR 81 | Ht 64.0 in | Wt 208.0 lb

## 2019-11-19 DIAGNOSIS — Z148 Genetic carrier of other disease: Secondary | ICD-10-CM | POA: Insufficient documentation

## 2019-11-19 DIAGNOSIS — Z01818 Encounter for other preprocedural examination: Secondary | ICD-10-CM | POA: Diagnosis not present

## 2019-11-19 LAB — POCT URINE PREGNANCY: Preg Test, Ur: NEGATIVE

## 2019-11-19 NOTE — Progress Notes (Signed)
Obstetrics & Gynecology Surgery H&P    Chief Complaint: Scheduled Surgery   History of Present Illness: Patient is a 34 y.o. [id number] presenting for scheduled TLH, BSO, and cystoscopy, for the treatment or further evaluation of Lynch Syndrome.   Prior Treatments prior to proceeding with surgery include: none  Preoperative Pap: 05/27/2017 NIL HPV negative Preoperative Endometrial biopsy: obtained today Preoperative Ultrasound: N/A   Review of Systems:10 point review of systems  Past Medical History:  Patient Active Problem List   Diagnosis Date Noted  . Family history of ovarian cancer 09/22/2019  . 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 Postpartum depression 11/01/2018  . Normal spontaneous vaginal delivery 10/06/2018  . Postpartum care following vaginal delivery 10/06/2018  . Encounter for elective induction of labor 10/04/2018  . Lynch syndrome 05/27/2017    MGF, mother, and maternal aunt have Lynch syndrome. Specifically MSH2 mutation (2634G>A) individuals with a MSH2 mutation have up to an 80% lifetime colon cancer risk (though this risk may be closer to 50% in women). In addition, there are increased risks for stomach (5%), small bowel (3% in women, 6% in men), urinary tract (12% in women, 28% in men), and brain (3%) cancers. Women with a MSH2 mutation have a 40-60% lifetime risk of uterine cancer and a 9-12% risk of ovarian cancer. She is aware that a complete hysterectomy will reduce but not eliminate the risk for uterine and ovarian cancer.        Marland Kitchen History of cervical dysplasia 05/27/2017    CIN 1 in 2012     Past Surgical History:  Past Surgical History:  Procedure Laterality Date  . COLONOSCOPY N/A 01/2014, 2017   HX of Lynch Syndrome (genetic)  . DILATION AND EVACUATION N/A 09/02/2017   Procedure: DILATATION AND EVACUATION;  Surgeon: Malachy Mood, MD;  Location: [facility] ORS;  Service: Gynecology;  Laterality: N/A;  . ENDOMETRIAL BIOPSY  2017   normal  . ESOPHAGOGASTRODUODENOSCOPY ENDOSCOPY      Family History:  Family History  Problem Relation Age of Onset  . Cardiomyopathy Maternal Grandmother   . Breast cancer Maternal Grandmother 69  . Stomach cancer Paternal Grandmother 23  . Colon cancer Maternal Grandfather        Lynch syndrome  . Ovarian cancer Maternal Aunt 30       lynch positive  . Uterine cancer Maternal Aunt     Social History:  Social History   Socioeconomic History  . Marital status: Married    Spouse name: Not on file  . Number of children: 1  . Years of education: Not on file  . Highest education level: Not on file  Occupational History  . Occupation: Teacher-special education  Tobacco Use  . Smoking status: Never Smoker  . Smokeless tobacco: Never Used  Substance and Sexual Activity  . Alcohol use: Not Currently    Comment: occasional  . Drug use: Never  . Sexual activity: Yes    Birth control/protection: Pill  Other Topics Concern  . Not on file  Social History Narrative  . Not on file   Social Determinants of Health   Financial Resource Strain:   . Difficulty of Paying Living Expenses:   Food Insecurity:   . Worried About Charity fundraiser in the Last Year:   . Arboriculturist in the Last Year:   Transportation Needs:   . Film/video editor (Medical):   Marland Kitchen Lack of Transportation (Non-Medical):   Physical Activity:   .  Days of Exercise per Week:   . Minutes of Exercise per Session:   Stress:   . Feeling of Stress :   Social Connections:   . Frequency of Communication with Friends and Family:   . Frequency of Social Gatherings with Friends and Family:   . Attends Religious Services:   . Active Member of Clubs or Organizations:   . Attends Archivist Meetings:   Marland Kitchen Marital Status:   Intimate Partner Violence:   . Fear of Current or Ex-Partner:   . Emotionally Abused:   Marland Kitchen Physically Abused:   . Sexually Abused:     Allergies:  Allergies  Allergen  Reactions  . Aloe Vera [Aloe] Rash  . Penicillins Rash    As infant Has patient had a PCN reaction causing immediate rash, facial/tongue/throat swelling, SOB or lightheadedness with hypotension: Unknown Has patient had a PCN reaction causing severe rash involving mucus membranes or skin necrosis: Unknown Has patient had a PCN reaction that required hospitalization: No Has patient had a PCN reaction occurring within the last 10 years: No If all of the above answers are "NO", then may proceed with Cephalosporin use.     Medications: Prior to Admission medications   Medication Sig Start Date End Date Taking? Authorizing Provider  COLLAGEN PO Take 1 capsule by mouth at bedtime.   Yes [provider]  escitalopram (LEXAPRO) 20 MG tablet Take 1 tablet (20 mg total) by mouth daily. Patient taking differently: Take 20 mg by mouth at bedtime.  09/20/19  Yes Malachy Mood, MD  Norethindrone Acetate-Ethinyl Estrad-FE (JUNEL FE 24) 1-20 MG-MCG(24) tablet Take 1 tablet by mouth daily. Patient taking differently: Take 1 tablet by mouth at bedtime.  09/20/19  Yes Malachy Mood, MD    Physical Exam Vitals: Blood pressure 112/72, pulse 81, height '5\' 4"'  (1.626 m), weight 208 lb (94.3 kg), last menstrual period 10/27/2019, not currently breastfeeding. General: NAD HEENT: normocephalic, anicteric Pulmonary: No increased work of breathing, CTAB Cardiovascular: RRR, distal pulses 2+ Abdomen: soft, non-tender, non-distedned Genitourinary:  External: Normal external female genitalia.  Normal urethral meatus, normal Bartholin's and Skene's glands.    Vagina: Normal vaginal mucosa, no evidence of prolapse.    Cervix: Grossly normal in appearance, no bleeding  Uterus: Non-enlarged, mobile, normal contour.  No CMT  Adnexa: ovaries non-enlarged, no adnexal masses  Rectal: deferred Extremities: no edema, erythema, or tenderness Neurologic: Grossly intact Psychiatric: mood appropriate, affect  full    ENDOMETRIAL BIOPSY     The indications for endometrial biopsy were reviewed.   Risks of the biopsy including cramping, bleeding, infection, uterine perforation, inadequate specimen and need for additional procedures  were discussed. The patient states she understands and agrees to undergo procedure today. Consent was signed. Time out was performed. Urine HCG was negative. A Graves speculum was placed and the cervix was brought into view.  The cervix was prepped with Betadine. A single-toothed tenaculum was not placed on the anterior lip of the cervix for traction. A 3 mm pipelle was introduced through the cervix into the endometrial cavity without difficulty to a depth of 8cm, and a small amount of tissue was obtained, the resulting specime sent to pathology. The instruments were removed from the patient's vagina. Minimal bleeding from the cervix was noted. The patient tolerated the procedure well. Routine post-procedure instructions were given to the patient.  She will be contacted by phone one results become available.     Imaging No results found.  Assessment: 34  y.o. [id number] presenting for scheduled TLH, BSO, cystoscopy  Plan: 1) Lynch Syndrome - NCCN - MSH2.  NCCN guidelines have no clear consensus on timing of hysterectomy but increased lifetime risk of both endometrial and ovarian cancer.  Average age of onset for ovarian cancer 24 with incidence of 8-38% of patient, average age of onset for endometrial cancer 47-48% with incidence of 21-57%.  Currently no surveillance for either has been recommended or found to be effective.  Timing of hysterectomy +/- BSO is individualized based on patient preferrance and completion of child bearing.  As Katura does not with to conceive in the future she has opted to proceed with hysterectomy and BSO. Patient opts for definitive surgical management via hysterectomy. The risks of surgery were discussed in detail with the patient including but not  limited to: bleeding which may require transfusion or reoperation; infection which may require antibiotics; injury to bowel, bladder, ureters or other surrounding organs (With a literature reported rate of urinary tract injury of 1% quoted); need for additional procedures including laparotomy; thromboembolic phenomenon, incisional problems and other postoperative/anesthesia complications.  Patient was also advised that recovery procedure generally involves an overnight stay; and the  expected recovery time after a hysterectomy being in the range of 6-8 weeks.  Likelihood of success in alleviating the patient's symptoms was discussed.  While definitive in regards to issues with menstural bleeding, pelvic pain if present preoperatively may continue and in fact worsen postoperatively.  She is aware that the procedure will render her unable to pursue childbearing in the future.   She was told that she will be contacted by our surgical scheduler regarding the time and date of her surgery; routine preoperative instructions of having nothing to eat or drink after midnight on the day prior to surgery and also coming to the hospital 1.5 hours prior to her time of surgery were also emphasized.  She was told she may be called for a preoperative appointment about a week prior to surgery and will be given further preoperative instructions at that visit.  Routine postoperative instructions will be reviewed with the patient and her family in detail after surgery. Printed patient education handouts about the procedure was given to the patient to review at home.   2) Routine postoperative instructions were reviewed with the patient and her family in detail today including the expected length of recovery and likely postoperative course.  The patient concurred with the proposed plan, giving informed written consent for the surgery today.  Patient instructed on the importance of being NPO after midnight prior to her procedure.  If  warranted preoperative prophylactic antibiotics and SCDs ordered on call to the OR to meet SCIP guidelines and adhere to recommendation laid forth in [employer] Number 104 May 2009  "Antibiotic Prophylaxis for Gynecologic Procedures".     Malachy Mood, MD, [facility], [facility] 11/19/2019, 6:40 PM

## 2019-11-23 ENCOUNTER — Inpatient Hospital Stay: Admission: RE | Admit: 2019-11-23 | Payer: BC Managed Care – PPO | Source: Ambulatory Visit

## 2019-11-24 LAB — SURGICAL PATHOLOGY

## 2019-11-25 ENCOUNTER — Encounter
Admission: RE | Admit: 2019-11-25 | Discharge: 2019-11-25 | Disposition: A | Discharge status: Home / Self Care | Payer: BC Managed Care – PPO | Source: Ambulatory Visit | Attending: Obstetrics and Gynecology | Admitting: Obstetrics and Gynecology

## 2019-11-25 ENCOUNTER — Other Ambulatory Visit: Payer: Self-pay

## 2019-11-25 DIAGNOSIS — Z01818 Encounter for other preprocedural examination: Secondary | ICD-10-CM | POA: Insufficient documentation

## 2019-11-25 HISTORY — DX: Gastro-esophageal reflux disease without esophagitis: K21.9

## 2019-11-25 HISTORY — DX: Prediabetes: R73.03

## 2019-11-25 NOTE — Patient Instructions (Signed)
Your procedure is scheduled on: Tues 6/8 Report to .Day Surgery   [facility] To find out your arrival time please call [phone] between 1PM - 3PM on .  Remember: Instructions that are not followed completely may result in serious medical risk,  up to and including death, or upon the discretion of your surgeon and anesthesiologist your  surgery may need to be rescheduled.     _X__ 1. Do not eat food after midnight the night before your procedure.                 No gum chewing or hard candies. You may drink clear liquids up to 2 hours                 before you are scheduled to arrive for your surgery- DO not drink clear                 liquids within 2 hours of the start of your surgery.                 Clear Liquids include:  water, apple juice without pulp, clear Gatorade, G2 or                  Gatorade Zero (avoid Red/Purple/Blue), Black Coffee or Tea (Do not add                 anything to coffee or tea). _____2.   Complete the carbohydrate drink provided to you, 2 hours before arrival.  __X__2.  On the morning of surgery brush your teeth with toothpaste and water, you                may rinse your mouth with mouthwash if you wish.  Do not swallow any toothpaste of mouthwash.     _X__ 3.  No Alcohol for 24 hours before or after surgery.   ___ 4.  Do Not Smoke or use e-cigarettes For 24 Hours Prior to Your Surgery.                 Do not use any chewable tobacco products for at least 6 hours prior to                 Surgery.  __  5.  Do not use any recreational drugs (marijuana, cocaine, heroin, ecstacy, MDMA or other)                For at least one week prior to your surgery.  Combination of these drugs with anesthesia                May have life threatening results.  ____  6.  Bring all medications with you on the day of surgery if instructed.   _x___  7.  Notify your doctor if there is any change in your medical condition      (cold, fever,  infections).     Do not wear jewelry, make-up, hairpins, clips or nail polish. Do not wear lotions, powders, or perfumes. You may wear deodorant. Do not shave 48 hours prior to surgery.  Do not bring valuables to the hospital.    [facility] is not responsible for any belongings or valuables.  Contacts, dentures or bridgework may not be worn into surgery. Leave your suitcase in the car. After surgery it may be brought to your room. For patients admitted to the hospital, discharge time is determined by your treatment team.  Patients discharged the day of surgery will not be allowed to drive home.   Make arrangements for someone to be with you for the first 24 hours of your Same Day Discharge.    Please read over the following fact sheets that you were given:    ____ Take these medicines the morning of surgery with A SIP OF WATER:    1. none  2.   3.   4.  5.  6.  ____ Fleet Enema (as directed)   __x__ Use CHG Soap (or wipes) as directed  ____ Use Benzoyl Peroxide Gel as instructed  ____ Use inhalers on the day of surgery  ____ Stop metformin 2 days prior to surgery    ____ Take 1/2 of usual insulin dose the night before surgery. No insulin the morning          of surgery.   ____ Stop Coumadin/Plavix/aspirin on  __x__ Stop Anti-inflammatories no ibuprofen aleve or aspirin until after surgery   ____ Stop supplements until after surgery.    ____ Bring C-Pap to the hospital.

## 2019-11-26 ENCOUNTER — Other Ambulatory Visit
Admission: RE | Admit: 2019-11-26 | Discharge: 2019-11-26 | Disposition: A | Discharge status: Home / Self Care | Payer: BC Managed Care – PPO | Source: Ambulatory Visit | Attending: Obstetrics and Gynecology | Admitting: Obstetrics and Gynecology

## 2019-11-26 DIAGNOSIS — Z01812 Encounter for preprocedural laboratory examination: Secondary | ICD-10-CM | POA: Insufficient documentation

## 2019-11-26 DIAGNOSIS — Z20822 Contact with and (suspected) exposure to covid-19: Secondary | ICD-10-CM | POA: Insufficient documentation

## 2019-11-26 LAB — BASIC METABOLIC PANEL
Anion gap: 8 (ref 5–15)
BUN: 13 mg/dL (ref 6–20)
CO2: 23 mmol/L (ref 22–32)
Calcium: 8.7 mg/dL — ABNORMAL LOW (ref 8.9–10.3)
Chloride: 109 mmol/L (ref 98–111)
Creatinine, Ser: 0.71 mg/dL (ref 0.44–1.00)
GFR calc Af Amer: >60 mL/min (ref >60)
GFR calc non Af Amer: >60 mL/min (ref >60)
Glucose, Bld: 85 mg/dL (ref 70–99)
Potassium: 3.7 mmol/L (ref 3.5–5.1)
Sodium: 140 mmol/L (ref 135–145)

## 2019-11-26 LAB — CBC
HCT: 37.7 % (ref 36.0–46.0)
Hemoglobin: 12.6 g/dL (ref 12.0–15.0)
MCH: 27.8 pg (ref 26.0–34.0)
MCHC: 33.4 g/dL (ref 30.0–36.0)
MCV: 83 fL (ref 80.0–100.0)
Platelets: 238 10*3/uL (ref 150–400)
RBC: 4.54 MIL/uL (ref 3.87–5.11)
RDW: 12.7 % (ref 11.5–15.5)
WBC: 7.9 10*3/uL (ref 4.0–10.5)
nRBC: 0 % (ref 0.0–0.2)

## 2019-11-26 LAB — SARS CORONAVIRUS 2 (TAT 6-24 HRS): SARS Coronavirus 2: NEGATIVE

## 2019-11-26 LAB — TYPE AND SCREEN
ABO/RH(D): A POS
Antibody Screen: NEGATIVE

## 2019-11-29 MED ORDER — GENTAMICIN SULFATE 40 MG/ML IJ SOLN
5.0000 mg/kg | INTRAVENOUS | Status: AC
  Administered 2019-11-30: 470 mg via INTRAVENOUS
  Filled 2019-11-29: qty 11.75

## 2019-11-29 MED ORDER — CLINDAMYCIN PHOSPHATE 900 MG/50ML IV SOLN
900.0000 mg | INTRAVENOUS | Status: AC
  Administered 2019-11-30: 900 mg via INTRAVENOUS

## 2019-11-30 ENCOUNTER — Observation Stay
Admission: RE | Admit: 2019-11-30 | Discharge: 2019-12-01 | Disposition: A | Discharge status: Home / Self Care | Payer: BC Managed Care – PPO | Attending: Obstetrics and Gynecology | Admitting: Obstetrics and Gynecology

## 2019-11-30 ENCOUNTER — Encounter
Admission: RE | Disposition: A | Discharge status: Home / Self Care | Payer: Self-pay | Source: Home / Self Care | Attending: Obstetrics and Gynecology

## 2019-11-30 ENCOUNTER — Other Ambulatory Visit: Payer: Self-pay

## 2019-11-30 ENCOUNTER — Observation Stay: Payer: BC Managed Care – PPO | Admitting: Certified Registered Nurse Anesthetist

## 2019-11-30 ENCOUNTER — Encounter: Payer: Self-pay | Admitting: Obstetrics and Gynecology

## 2019-11-30 DIAGNOSIS — Z79899 Other long term (current) drug therapy: Secondary | ICD-10-CM | POA: Diagnosis not present

## 2019-11-30 DIAGNOSIS — Z8041 Family history of malignant neoplasm of ovary: Secondary | ICD-10-CM | POA: Diagnosis not present

## 2019-11-30 DIAGNOSIS — Z1509 Genetic susceptibility to other malignant neoplasm: Secondary | ICD-10-CM | POA: Diagnosis not present

## 2019-11-30 DIAGNOSIS — Z793 Long term (current) use of hormonal contraceptives: Secondary | ICD-10-CM | POA: Insufficient documentation

## 2019-11-30 DIAGNOSIS — K219 Gastro-esophageal reflux disease without esophagitis: Secondary | ICD-10-CM | POA: Insufficient documentation

## 2019-11-30 DIAGNOSIS — Z90710 Acquired absence of both cervix and uterus: Secondary | ICD-10-CM | POA: Diagnosis present

## 2019-11-30 HISTORY — PX: CYSTOSCOPY: SHX5120

## 2019-11-30 HISTORY — PX: TOTAL LAPAROSCOPIC HYSTERECTOMY WITH BILATERAL SALPINGO OOPHORECTOMY: SHX6845

## 2019-11-30 LAB — POCT PREGNANCY, URINE: Preg Test, Ur: NEGATIVE

## 2019-11-30 SURGERY — HYSTERECTOMY, TOTAL, LAPAROSCOPIC, WITH BILATERAL SALPINGO-OOPHORECTOMY
Anesthesia: General

## 2019-11-30 MED ORDER — ONDANSETRON HCL 4 MG PO TABS: 4.0000 mg | ORAL_TABLET | Freq: Four times a day (QID) | ORAL | Status: DC | PRN

## 2019-11-30 MED ORDER — DEXAMETHASONE SODIUM PHOSPHATE 10 MG/ML IJ SOLN
INTRAMUSCULAR | Status: DC | PRN
  Administered 2019-11-30: 10 mg via INTRAVENOUS

## 2019-11-30 MED ORDER — OXYCODONE-ACETAMINOPHEN 5-325 MG PO TABS: 1.0000 | ORAL_TABLET | ORAL | Status: DC | PRN

## 2019-11-30 MED ORDER — KETOROLAC TROMETHAMINE 30 MG/ML IJ SOLN
INTRAMUSCULAR | Status: AC
  Filled 2019-11-30: qty 1

## 2019-11-30 MED ORDER — PROPOFOL 10 MG/ML IV BOLUS
INTRAVENOUS | Status: DC | PRN
  Administered 2019-11-30: 150 mg via INTRAVENOUS
  Administered 2019-11-30: 50 mg via INTRAVENOUS

## 2019-11-30 MED ORDER — CLINDAMYCIN PHOSPHATE 900 MG/50ML IV SOLN
INTRAVENOUS | Status: AC
  Filled 2019-11-30: qty 50

## 2019-11-30 MED ORDER — DEXTROSE-NACL 5-0.45 % IV SOLN: INTRAVENOUS | Status: DC

## 2019-11-30 MED ORDER — OXYCODONE HCL 5 MG/5ML PO SOLN: 5.0000 mg | Freq: Once | ORAL | Status: DC | PRN

## 2019-11-30 MED ORDER — DIPHENHYDRAMINE HCL 50 MG/ML IJ SOLN
INTRAMUSCULAR | Status: AC
  Filled 2019-11-30: qty 1

## 2019-11-30 MED ORDER — FAMOTIDINE 20 MG PO TABS: 20.0000 mg | ORAL_TABLET | Freq: Once | ORAL | Status: AC

## 2019-11-30 MED ORDER — GLYCOPYRROLATE 0.2 MG/ML IJ SOLN
INTRAMUSCULAR | Status: DC | PRN
  Administered 2019-11-30: .2 mg via INTRAVENOUS

## 2019-11-30 MED ORDER — SUGAMMADEX SODIUM 200 MG/2ML IV SOLN
INTRAVENOUS | Status: DC | PRN
  Administered 2019-11-30: 200 mg via INTRAVENOUS

## 2019-11-30 MED ORDER — BUPIVACAINE HCL (PF) 0.5 % IJ SOLN
INTRAMUSCULAR | Status: AC
  Filled 2019-11-30: qty 30

## 2019-11-30 MED ORDER — EPHEDRINE SULFATE 50 MG/ML IJ SOLN
INTRAMUSCULAR | Status: DC | PRN
  Administered 2019-11-30 (×6): 5 mg via INTRAVENOUS

## 2019-11-30 MED ORDER — PROPOFOL 10 MG/ML IV BOLUS
INTRAVENOUS | Status: AC
  Filled 2019-11-30: qty 40

## 2019-11-30 MED ORDER — ESCITALOPRAM OXALATE 10 MG PO TABS
20.0000 mg | ORAL_TABLET | Freq: Every day | ORAL | Status: DC
  Administered 2019-11-30: 20 mg via ORAL
  Filled 2019-11-30: qty 2

## 2019-11-30 MED ORDER — FAMOTIDINE 20 MG PO TABS
ORAL_TABLET | ORAL | Status: AC
  Administered 2019-11-30: 20 mg via ORAL
  Filled 2019-11-30: qty 1

## 2019-11-30 MED ORDER — ACETAMINOPHEN 325 MG PO TABS
650.0000 mg | ORAL_TABLET | ORAL | Status: DC | PRN
  Administered 2019-11-30 – 2019-12-01 (×4): 650 mg via ORAL
  Filled 2019-11-30 (×4): qty 2

## 2019-11-30 MED ORDER — ONDANSETRON HCL 4 MG/2ML IJ SOLN
INTRAMUSCULAR | Status: DC | PRN
  Administered 2019-11-30: 4 mg via INTRAVENOUS

## 2019-11-30 MED ORDER — SEVOFLURANE IN SOLN
RESPIRATORY_TRACT | Status: AC
  Filled 2019-11-30: qty 250

## 2019-11-30 MED ORDER — ACETAMINOPHEN 10 MG/ML IV SOLN
INTRAVENOUS | Status: DC | PRN
  Administered 2019-11-30: 1000 mg via INTRAVENOUS

## 2019-11-30 MED ORDER — IBUPROFEN 600 MG PO TABS
600.0000 mg | ORAL_TABLET | Freq: Four times a day (QID) | ORAL | Status: DC | PRN
  Administered 2019-11-30 – 2019-12-01 (×4): 600 mg via ORAL
  Filled 2019-11-30 (×4): qty 1

## 2019-11-30 MED ORDER — CHLORHEXIDINE GLUCONATE 0.12 % MT SOLN
OROMUCOSAL | Status: AC
  Administered 2019-11-30: 15 mL via OROMUCOSAL
  Filled 2019-11-30: qty 15

## 2019-11-30 MED ORDER — HEMOSTATIC AGENTS (NO CHARGE) OPTIME
TOPICAL | Status: DC | PRN
  Administered 2019-11-30: 1 via TOPICAL

## 2019-11-30 MED ORDER — ACETAMINOPHEN 10 MG/ML IV SOLN
INTRAVENOUS | Status: AC
  Filled 2019-11-30: qty 100

## 2019-11-30 MED ORDER — FENTANYL CITRATE (PF) 100 MCG/2ML IJ SOLN
INTRAMUSCULAR | Status: AC
  Administered 2019-11-30: 25 ug via INTRAVENOUS
  Filled 2019-11-30: qty 2

## 2019-11-30 MED ORDER — MORPHINE SULFATE (PF) 2 MG/ML IV SOLN: 1.0000 mg | INTRAVENOUS | Status: DC | PRN

## 2019-11-30 MED ORDER — MIDAZOLAM HCL 2 MG/2ML IJ SOLN
INTRAMUSCULAR | Status: AC
  Filled 2019-11-30: qty 2

## 2019-11-30 MED ORDER — MIDAZOLAM HCL 2 MG/2ML IJ SOLN
INTRAMUSCULAR | Status: DC | PRN
  Administered 2019-11-30: 2 mg via INTRAVENOUS

## 2019-11-30 MED ORDER — SODIUM CHLORIDE 0.9 % IV SOLN
INTRAVENOUS | Status: DC | PRN
  Administered 2019-11-30: 40 ug/min via INTRAVENOUS

## 2019-11-30 MED ORDER — GLYCOPYRROLATE 0.2 MG/ML IJ SOLN
INTRAMUSCULAR | Status: AC
  Filled 2019-11-30: qty 1

## 2019-11-30 MED ORDER — OXYCODONE HCL 5 MG PO TABS: 5.0000 mg | ORAL_TABLET | ORAL | Status: DC | PRN

## 2019-11-30 MED ORDER — PROPOFOL 10 MG/ML IV BOLUS
INTRAVENOUS | Status: AC
  Filled 2019-11-30: qty 20

## 2019-11-30 MED ORDER — PHENYLEPHRINE HCL (PRESSORS) 10 MG/ML IV SOLN
INTRAVENOUS | Status: DC | PRN
  Administered 2019-11-30 (×2): 100 ug via INTRAVENOUS
  Administered 2019-11-30: 50 ug via INTRAVENOUS

## 2019-11-30 MED ORDER — DIPHENHYDRAMINE HCL 50 MG/ML IJ SOLN
INTRAMUSCULAR | Status: DC | PRN
  Administered 2019-11-30: 12.5 mg via INTRAVENOUS

## 2019-11-30 MED ORDER — ROCURONIUM BROMIDE 100 MG/10ML IV SOLN
INTRAVENOUS | Status: DC | PRN
  Administered 2019-11-30: 50 mg via INTRAVENOUS

## 2019-11-30 MED ORDER — ONDANSETRON HCL 4 MG/2ML IJ SOLN: 4.0000 mg | Freq: Four times a day (QID) | INTRAMUSCULAR | Status: DC | PRN

## 2019-11-30 MED ORDER — LACTATED RINGERS IV SOLN: INTRAVENOUS | Status: DC

## 2019-11-30 MED ORDER — LIDOCAINE HCL (CARDIAC) PF 100 MG/5ML IV SOSY
PREFILLED_SYRINGE | INTRAVENOUS | Status: DC | PRN
  Administered 2019-11-30: 80 mg via INTRAVENOUS

## 2019-11-30 MED ORDER — DEXMEDETOMIDINE HCL 200 MCG/2ML IV SOLN
INTRAVENOUS | Status: DC | PRN
  Administered 2019-11-30 (×2): 4 ug via INTRAVENOUS

## 2019-11-30 MED ORDER — MENTHOL 3 MG MT LOZG
1.0000 | LOZENGE | OROMUCOSAL | Status: DC | PRN
  Filled 2019-11-30: qty 9

## 2019-11-30 MED ORDER — FENTANYL CITRATE (PF) 100 MCG/2ML IJ SOLN
INTRAMUSCULAR | Status: DC | PRN
  Administered 2019-11-30 (×3): 25 ug via INTRAVENOUS
  Administered 2019-11-30: 50 ug via INTRAVENOUS
  Administered 2019-11-30: 25 ug via INTRAVENOUS

## 2019-11-30 MED ORDER — BUPIVACAINE HCL 0.5 % IJ SOLN
INTRAMUSCULAR | Status: DC | PRN
  Administered 2019-11-30: 14 mL

## 2019-11-30 MED ORDER — FENTANYL CITRATE (PF) 100 MCG/2ML IJ SOLN
INTRAMUSCULAR | Status: AC
  Filled 2019-11-30: qty 2

## 2019-11-30 MED ORDER — EPHEDRINE 5 MG/ML INJ
INTRAVENOUS | Status: AC
  Filled 2019-11-30: qty 10

## 2019-11-30 MED ORDER — OXYCODONE HCL 5 MG PO TABS: 5.0000 mg | ORAL_TABLET | Freq: Once | ORAL | Status: DC | PRN

## 2019-11-30 MED ORDER — CHLORHEXIDINE GLUCONATE 0.12 % MT SOLN: 15.0000 mL | Freq: Once | OROMUCOSAL | Status: AC

## 2019-11-30 MED ORDER — KETOROLAC TROMETHAMINE 30 MG/ML IJ SOLN
INTRAMUSCULAR | Status: DC | PRN
  Administered 2019-11-30: 30 mg via INTRAVENOUS

## 2019-11-30 MED ORDER — FENTANYL CITRATE (PF) 100 MCG/2ML IJ SOLN: 25.0000 ug | INTRAMUSCULAR | Status: DC | PRN

## 2019-11-30 MED ORDER — ORAL CARE MOUTH RINSE: 15.0000 mL | Freq: Once | OROMUCOSAL | Status: AC

## 2019-11-30 SURGICAL SUPPLY — 55 items
BAG URINE DRAIN 2000ML AR STRL (UROLOGICAL SUPPLIES) ×4 IMPLANT
BLADE SURG SZ11 CARB STEEL (BLADE) ×4 IMPLANT
CANISTER SUCT 1200ML W/VALVE (MISCELLANEOUS) ×4 IMPLANT
CATH FOLEY 2WAY  5CC 16FR (CATHETERS) ×2
CATH URTH 16FR FL 2W BLN LF (CATHETERS) ×2 IMPLANT
CHLORAPREP W/TINT 26 (MISCELLANEOUS) ×4 IMPLANT
COVER WAND RF STERILE (DRAPES) ×4 IMPLANT
DEFOGGER SCOPE WARMER CLEARIFY (MISCELLANEOUS) ×4 IMPLANT
DERMABOND ADVANCED (GAUZE/BANDAGES/DRESSINGS) ×2
DERMABOND ADVANCED .7 DNX12 (GAUZE/BANDAGES/DRESSINGS) ×2 IMPLANT
DEVICE SUTURE ENDOST 10MM (ENDOMECHANICALS) ×4 IMPLANT
DRESSING SURGICEL FIBRLLR 1X2 (HEMOSTASIS) ×2 IMPLANT
DRSG SURGICEL FIBRILLAR 1X2 (HEMOSTASIS) ×4
GAUZE 4X4 16PLY RFD (DISPOSABLE) ×4 IMPLANT
GLOVE BIO SURGEON STRL SZ7 (GLOVE) ×16 IMPLANT
GLOVE INDICATOR 7.5 STRL GRN (GLOVE) ×4 IMPLANT
GOWN STRL REUS W/ TWL LRG LVL3 (GOWN DISPOSABLE) ×4 IMPLANT
GOWN STRL REUS W/ TWL XL LVL3 (GOWN DISPOSABLE) ×2 IMPLANT
GOWN STRL REUS W/TWL LRG LVL3 (GOWN DISPOSABLE) ×4
GOWN STRL REUS W/TWL XL LVL3 (GOWN DISPOSABLE) ×2
GRASPER SUT TROCAR 14GX15 (MISCELLANEOUS) ×4 IMPLANT
IRRIGATION STRYKERFLOW (MISCELLANEOUS) ×2 IMPLANT
IRRIGATOR STRYKERFLOW (MISCELLANEOUS) ×4
IV LACTATED RINGERS 1000ML (IV SOLUTION) ×4 IMPLANT
IV NS 1000ML (IV SOLUTION) ×2
IV NS 1000ML BAXH (IV SOLUTION) ×2 IMPLANT
KIT PINK PAD W/HEAD ARE REST (MISCELLANEOUS) ×4
KIT PINK PAD W/HEAD ARM REST (MISCELLANEOUS) ×2 IMPLANT
LABEL OR SOLS (LABEL) ×4 IMPLANT
MANIPULATOR VCARE LG CRV RETR (MISCELLANEOUS) ×4 IMPLANT
MANIPULATOR VCARE SML CRV RETR (MISCELLANEOUS) IMPLANT
MANIPULATOR VCARE STD CRV RETR (MISCELLANEOUS) IMPLANT
NS IRRIG 1000ML POUR BTL (IV SOLUTION) ×4 IMPLANT
NS IRRIG 500ML POUR BTL (IV SOLUTION) ×4 IMPLANT
OCCLUDER COLPOPNEUMO (BALLOONS) IMPLANT
PACK GYN LAPAROSCOPIC (MISCELLANEOUS) ×4 IMPLANT
PAD OB MATERNITY 4.3X12.25 (PERSONAL CARE ITEMS) ×4 IMPLANT
PAD PREP 24X41 OB/GYN DISP (PERSONAL CARE ITEMS) ×4 IMPLANT
SCISSORS METZENBAUM CVD 33 (INSTRUMENTS) ×4 IMPLANT
SET CYSTO W/LG BORE CLAMP LF (SET/KITS/TRAYS/PACK) ×4 IMPLANT
SHEARS HARMONIC ACE PLUS 36CM (ENDOMECHANICALS) ×4 IMPLANT
SLEEVE ENDOPATH XCEL 5M (ENDOMECHANICALS) ×4 IMPLANT
STRAP SAFETY 5IN WIDE (MISCELLANEOUS) ×4 IMPLANT
SURGILUBE 2OZ TUBE FLIPTOP (MISCELLANEOUS) ×4 IMPLANT
SUT ENDO VLOC 180-0-8IN (SUTURE) ×4 IMPLANT
SUT MNCRL 4-0 (SUTURE) ×4
SUT MNCRL 4-0 27XMFL (SUTURE) ×4
SUT VIC AB 0 CT1 27 (SUTURE) ×2
SUT VIC AB 0 CT1 27XCR 8 STRN (SUTURE) ×2 IMPLANT
SUTURE MNCRL 4-0 27XMF (SUTURE) ×4 IMPLANT
SYR 10ML LL (SYRINGE) ×4 IMPLANT
SYR 50ML LL SCALE MARK (SYRINGE) ×4 IMPLANT
TROCAR ENDO BLADELESS 11MM (ENDOMECHANICALS) ×4 IMPLANT
TROCAR XCEL NON-BLD 5MMX100MML (ENDOMECHANICALS) ×4 IMPLANT
TUBING EVAC SMOKE HEATED PNEUM (TUBING) ×4 IMPLANT

## 2019-11-30 NOTE — Anesthesia Procedure Notes (Signed)
Procedure Name: Intubation Date/Time: 11/30/2019 7:40 AM Performed by: Joanette Gula, Summer, RN Pre-anesthesia Checklist: Patient identified, Emergency Drugs available, Suction available and Patient being monitored Patient Re-evaluated:Patient Re-evaluated prior to induction Oxygen Delivery Method: Circle system utilized Preoxygenation: Pre-oxygenation with 100% oxygen Induction Type: IV induction Ventilation: Mask ventilation without difficulty Laryngoscope Size: McGraph and 3 Grade View: Grade I Tube type: Oral Tube size: 7.0 mm Number of attempts: 1 Airway Equipment and Method: Stylet Placement Confirmation: ETT inserted through vocal cords under direct vision,  positive ETCO2 and breath sounds checked- equal and bilateral Secured at: 20 cm Tube secured with: Tape Dental Injury: Teeth and Oropharynx as per pre-operative assessment

## 2019-11-30 NOTE — Anesthesia Preprocedure Evaluation (Signed)
Anesthesia Evaluation  Patient identified by MRN, date of birth, ID band Patient awake    Reviewed: Allergy & Precautions, H&P , NPO status , Patient's Chart, lab work & pertinent test results  History of Anesthesia Complications Negative for: history of anesthetic complications  Airway Mallampati: III  TM Distance: >3 FB Neck ROM: full    Dental  (+) Chipped   Pulmonary neg pulmonary ROS, neg shortness of breath,    Pulmonary exam normal        Cardiovascular Exercise Tolerance: Good (-) angina(-) Past MI and (-) DOE negative cardio ROS Normal cardiovascular exam     Neuro/Psych PSYCHIATRIC DISORDERS negative neurological ROS     GI/Hepatic Neg liver ROS, GERD  Medicated and Controlled,  Endo/Other  negative endocrine ROS  Renal/GU      Musculoskeletal   Abdominal   Peds  Hematology negative hematology ROS (+)   Anesthesia Other Findings Past Medical History: 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 No date: GERD (gastroesophageal reflux disease) No date: Lynch syndrome     Comment:  per [facility] chart positive for MSH2- needs annual pelvic u/s,              CA 125, endometrial bx, urine cytology, colonoscopy and               endoscopy No date: Post partum depression No date: Pre-diabetes  Past Surgical History: 01/2014, 2017: COLONOSCOPY; N/A     Comment:  HX of Lynch Syndrome (genetic) 09/02/2017: DILATION AND EVACUATION; N/A     Comment:  Procedure: DILATATION AND EVACUATION;  Surgeon:               Malachy Mood, MD;  Location: [facility] ORS;  Service:               Gynecology;  Laterality: N/A; 2017: ENDOMETRIAL BIOPSY     Comment:  normal No date: ESOPHAGOGASTRODUODENOSCOPY ENDOSCOPY     Reproductive/Obstetrics negative OB ROS                             Anesthesia Physical Anesthesia Plan  ASA: II  Anesthesia Plan: General ETT   Post-op Pain Management:     Induction: Intravenous  PONV Risk Score and Plan: Ondansetron, Dexamethasone, Midazolam and Treatment may vary due to age or medical condition  Airway Management Planned: Oral ETT  Additional Equipment:   Intra-op Plan:   Post-operative Plan: Extubation in OR  Informed Consent: I have reviewed the patients History and Physical, chart, labs and discussed the procedure including the risks, benefits and alternatives for the proposed anesthesia with the patient or authorized representative who has indicated his/her understanding and acceptance.     Dental Advisory Given  Plan Discussed with: Anesthesiologist, CRNA and Surgeon  Anesthesia Plan Comments: (Patient consented for risks of anesthesia including but not limited to:  - adverse reactions to medications - damage to eyes, teeth, lips or other oral mucosa - nerve damage due to positioning  - sore throat or hoarseness - Damage to heart, brain, nerves, lungs, other parts of body or loss of life  Patient voiced understanding.)        Anesthesia Quick Evaluation

## 2019-11-30 NOTE — Anesthesia Postprocedure Evaluation (Signed)
Anesthesia Post Note  Patient: Jessica Cardenas  Procedure(s) Performed: TOTAL LAPAROSCOPIC HYSTERECTOMY WITH BILATERAL SALPINGO OOPHORECTOMY (Bilateral ) CYSTOSCOPY (N/A )  Patient location during evaluation: PACU Anesthesia Type: General Level of consciousness: awake and alert and oriented Pain management: pain level controlled Vital Signs Assessment: post-procedure vital signs reviewed and stable Respiratory status: spontaneous breathing, nonlabored ventilation and respiratory function stable Cardiovascular status: blood pressure returned to baseline and stable Postop Assessment: no signs of nausea or vomiting Anesthetic complications: no     Last Vitals:  Vitals:   11/30/19 0948 11/30/19 1005  BP: 112/60 (!) 100/54  Pulse: 90 87  Resp: 13 15  Temp:  (!) 36.4 C  SpO2: 100% 97%    Last Pain:  Vitals:   11/30/19 0931  TempSrc:   PainSc: 4                  Jerol Rufener

## 2019-11-30 NOTE — H&P (Signed)
Date of Initial H&P: 11/19/2019  History reviewed, patient examined, no change in status, stable for surgery.

## 2019-11-30 NOTE — Op Note (Addendum)
Preoperative Diagnosis: 1) 34 y.o. with Lynch Syndrome (MSH-2)  Postoperative Diagnosis: 1) 34 y.o. 1) 34 y.o. with Lynch Syndrome (MSH-2)  Operation Performed: Total laparoscopic hysterectomy, bilateral salpingo-oophorectomy, and cystoscopy  Indication: Lynch Syndrome, done with child bearing, desiring risk reducing hysterectomy and salpingo-oophorectomy   Surgeon: Malachy Mood, MD  Assistant: Barnett Applebaum, MD this surgery required a high level surgical assistant with none other readily available  Anesthesia: General  Preoperative Antibiotics: Gentamycin and Clindamycin  Estimated Blood Loss: 50 mL  IV Fluids: 1L crystaloid  Urine Output:: 571m of clear u rine  Drains or Tubes: Foley to gravity drainage  Implants: none  Specimens Removed: Uterus, cervix, bilateral ovaries and fallopian tubes  Complications: none  Intraoperative Findings: Normal cervix, uterus, and ovaries.  Cystoscopy showing intact bladder dome, bilateral efflux of urine from both ureteral orifices.    Patient Condition: stable  Procedure in Detail:  Patient was taken to the operating room where she was administered general anesthesia.  She was positioned in the dorsal lithotomy position utilizing Allen stirups, prepped and draped in the usual sterile fashion.  Prior to proceeding with procedure a time out was performed.  Attention was turned to the patient's pelvis.  An indwelling foley catheter was placed to decompress the patient's bladder.  An operative speculum was placed to allow visualization of the cervix.  The anterior lip of the cervix was grasped with a single tooth tenaculum, and a large V-care uterine manipulator was placed to allow manipulation of the uterus.  The operative speculum and single tooth tenaculum were then removed.  Attention was turned to the patient's abdomen.  The umbilicus was infiltrated with 1% Sensorcaine, before making a stab incision using an 11 blade scalpel.  A 556m Excel trocar was then used to gain direct entry into the peritoneal cavity utilizing the camera to visualize progress of the trocar during placement.  Once peritoneal entry had been achieved, insufflation was started and pneumoperitoneum established at a pressure of 1588m.    One left and one right lower quadrant site were then injected with 1% Sensorcaine and a stab incision was made using an 11 blade scalpel.  Two additional 5mm47mcel trocars were placed through these incisions under direct visualization, the umbilical trocar was stepped up to an 11mm52mel trocar.  General inspection of the abdomen revealed the above noted findings.   The left tube and ovary were identified and grasped.  The ovary was transected from its attachments infundibulopelvic ligament using a 5mm H72monic scalpel.  The round ligament was then likewise ligated and transected.  The anterior leaf of the broad ligament was dissected down to the level of the internal cervical os and a bladder flap was started.  The posterior leaf of the broad ligament was dissected down to the utero-sacral ligament.  The uterine artery was skeletonized before being ligated and transected using the Harmonic scalpel with cephelad pressure applied to the V-care device to assure lateralization of the ureter.  A bite was then taken with Harmonic medial to transected portion of uterine artery to further lateralize the ureter and vessel off the V-care cup.  The patient's right adnexal structures were then dissected in similar fashion by Dr. HarrisKenton Kingfisher bladder flap was completed and the bladder mobilized off the V-care cup.  An anterior colpotomy was scores and carried around in a clockwise fashion to free the specimen, which was then removed vaginally.  Inspection revealed all pedicles to be hemostatic before proceed with vaginal  closure.  The vaginal cuff was removed with a running V-loc suture using an endostitch device.  Following closure of the cuff the  pedicles were inspected. There was some bleeding from the right uterine artery pedicle which was carefully cauterized using bipolar grasper.  The pelvis was irrigated.  Fibrillar was applied to the left uterine artery pedicle.  A Carter-Thompson was used to close the 26m port site utilizing a 0 Vicryl suture.  Pneumoperitoneum was evacuated and trocars were removed.  The 18mtrocar site was closed using a 4-0 Monocryl in a subcuticular fashion.  All trocar sites were then dressed with surgical skin glue  The indwelling foley catheter was removed.  Cystoscopy was performed noting and intact bladder dome as well as brisk efflux of urine from bother ureteral orifices.  The cystoscopy was removed and the indwelling foley catheter was replaced.  Sponge needle and instrument counts were correct time two.  The patient tolerated the procedure well and was taken to the recovery room in stable condition.

## 2019-11-30 NOTE — Transfer of Care (Signed)
Immediate Anesthesia Transfer of Care Note  Patient: Jessica Cardenas  Procedure(s) Performed: TOTAL LAPAROSCOPIC HYSTERECTOMY WITH BILATERAL SALPINGO OOPHORECTOMY (Bilateral ) CYSTOSCOPY (N/A )  Patient Location: PACU  Anesthesia Type:General  Level of Consciousness: drowsy and patient cooperative  Airway & Oxygen Therapy: Patient Spontanous Breathing and Patient connected to face mask oxygen  Post-op Assessment: Report given to RN and Post -op Vital signs reviewed and stable  Post vital signs: Reviewed and stable  Last Vitals:  Vitals Value Taken Time  BP 118/74 11/30/19 0918  Temp    Pulse 85 11/30/19 0924  Resp 15 11/30/19 0924  SpO2 100 % 11/30/19 0924  Vitals shown include unvalidated device data.  Last Pain:  Vitals:   11/30/19 0918  TempSrc:   PainSc: (P) Asleep         Complications: No apparent anesthesia complications

## 2019-12-01 DIAGNOSIS — Z1509 Genetic susceptibility to other malignant neoplasm: Secondary | ICD-10-CM | POA: Diagnosis not present

## 2019-12-01 LAB — BASIC METABOLIC PANEL
Anion gap: 6 (ref 5–15)
BUN: 9 mg/dL (ref 6–20)
CO2: 25 mmol/L (ref 22–32)
Calcium: 8.6 mg/dL — ABNORMAL LOW (ref 8.9–10.3)
Chloride: 110 mmol/L (ref 98–111)
Creatinine, Ser: 0.73 mg/dL (ref 0.44–1.00)
GFR calc Af Amer: >60 mL/min (ref >60)
GFR calc non Af Amer: >60 mL/min (ref >60)
Glucose, Bld: 126 mg/dL — ABNORMAL HIGH (ref 70–99)
Potassium: 4.4 mmol/L (ref 3.5–5.1)
Sodium: 141 mmol/L (ref 135–145)

## 2019-12-01 LAB — CBC
HCT: 37 % (ref 36.0–46.0)
Hemoglobin: 12.3 g/dL (ref 12.0–15.0)
MCH: 28 pg (ref 26.0–34.0)
MCHC: 33.2 g/dL (ref 30.0–36.0)
MCV: 84.1 fL (ref 80.0–100.0)
Platelets: 230 10*3/uL (ref 150–400)
RBC: 4.4 MIL/uL (ref 3.87–5.11)
RDW: 12.9 % (ref 11.5–15.5)
WBC: 15.5 10*3/uL — ABNORMAL HIGH (ref 4.0–10.5)
nRBC: 0 % (ref 0.0–0.2)

## 2019-12-01 MED ORDER — ESTRADIOL 0.05 MG/24HR TD PTWK
0.1000 mg | MEDICATED_PATCH | TRANSDERMAL | Status: DC
  Administered 2019-12-01: 0.1 mg via TRANSDERMAL
  Filled 2019-12-01: qty 2

## 2019-12-01 MED ORDER — ESTRADIOL 0.1 MG/24HR TD PTWK: 0.1000 mg | MEDICATED_PATCH | TRANSDERMAL | 11 refills | Status: DC

## 2019-12-01 MED ORDER — IBUPROFEN 600 MG PO TABS: 600.0000 mg | ORAL_TABLET | Freq: Four times a day (QID) | ORAL | 0 refills | Status: DC | PRN

## 2019-12-01 MED ORDER — ESTRADIOL 0.1 MG/24HR TD PTTW: 1.0000 | MEDICATED_PATCH | TRANSDERMAL | 3 refills | Status: DC

## 2019-12-01 MED ORDER — OXYCODONE-ACETAMINOPHEN 5-325 MG PO TABS: 1.0000 | ORAL_TABLET | ORAL | 0 refills | Status: DC | PRN

## 2019-12-01 MED ORDER — ESTRADIOL 0.1 MG/24HR TD PTTW: 1.0000 | MEDICATED_PATCH | TRANSDERMAL | 11 refills | Status: DC

## 2019-12-01 NOTE — Discharge Summary (Addendum)
Physician Discharge Summary  Patient ID: Jessica Cardenas MRN: [id number] DOB/AGE: 10-10-1985 34 y.o.  Admit date: 11/30/2019 Discharge date: 12/01/2019  Admission Diagnoses:  Discharge Diagnoses:  Active Problems:   MSH2-related Lynch syndrome (HNPCC1)   Status post hysterectomy   Discharged Condition: good  Hospital Course: 34 year old with Lynch Syndrome (MSH2) who is done with child bearing and desired risk reducing TLH, BSO given increased risk of ovarian and endometrial cancer (average age of onset for ovarian cancer 9 with incidence of 8-38% of patient, average age of onset for endometrial cancer 42-48 with incidence of 21-57%).  The patient underwent uncomplicated TLH, BSO, cystoscopy 11/30/2019.  Her postoperative course was benign.  She was tolerating po, ambulating, passing flatus, voiding spontaneously, reported good pain control, and remained hemodynamically stable and afebrile throughout admission.  POD1 labs showed stable H&H, BUN, Cr.  She will be started on Vivelle dot patch prior to discharge to avoid vasomotor symptoms given oophorectomy.  Consults: None  Significant Diagnostic Studies:  Results for orders placed or performed during the hospital encounter of 11/30/19 (from the past 24 hour(s))  CBC     Status: Abnormal   Collection Time: 12/01/19  4:36 AM  Result Value Ref Range   WBC 15.5 (H) 4.0 - 10.5 K/uL   RBC 4.40 3.87 - 5.11 MIL/uL   Hemoglobin 12.3 12.0 - 15.0 g/dL   HCT 37.0 36.0 - 46.0 %   MCV 84.1 80.0 - 100.0 fL   MCH 28.0 26.0 - 34.0 pg   MCHC 33.2 30.0 - 36.0 g/dL   RDW 12.9 11.5 - 15.5 %   Platelets 230 150 - 400 K/uL   nRBC 0.0 0.0 - 0.2 %  Basic metabolic panel     Status: Abnormal   Collection Time: 12/01/19  4:36 AM  Result Value Ref Range   Sodium 141 135 - 145 mmol/L   Potassium 4.4 3.5 - 5.1 mmol/L   Chloride 110 98 - 111 mmol/L   CO2 25 22 - 32 mmol/L   Glucose, Bld 126 (H) 70 - 99 mg/dL   BUN 9 6 - 20 mg/dL   Creatinine, Ser 0.73  0.44 - 1.00 mg/dL   Calcium 8.6 (L) 8.9 - 10.3 mg/dL   GFR calc non Af Amer >60 >60 mL/min   GFR calc Af Amer >60 >60 mL/min   Anion gap 6 5 - 15     Treatments: surgery: TLH, BSO, cystoscopy 11/30/2019  Discharge Exam: Blood pressure 101/63, pulse 68, temperature 98.3 F (36.8 C), temperature source Oral, resp. rate 18, height 5' 4.02" (1.626 m), weight 94.3 kg, last menstrual period 11/30/2019, SpO2 98 %, not currently breastfeeding. General appearance: alert, appears stated age and no distress Resp: no increased work of breathing GI: NABS, soft, non-distended, non-tender, trochar sites D/C/I Extremities: extremities normal, atraumatic, no cyanosis or edema  Disposition: Discharge disposition: 01-Home or Self Care       Discharge Instructions    Call MD for:   Complete by: As directed    Heavy vaginal bleeding greater than 1 pad an hour   Call MD for:  difficulty breathing, headache or visual disturbances   Complete by: As directed    Call MD for:  extreme fatigue   Complete by: As directed    Call MD for:  hives   Complete by: As directed    Call MD for:  persistant dizziness or light-headedness   Complete by: As directed    Call MD for:  persistant nausea and vomiting   Complete by: As directed    Call MD for:  redness, tenderness, or signs of infection (pain, swelling, redness, odor or green/yellow discharge around incision site)   Complete by: As directed    Call MD for:  severe uncontrolled pain   Complete by: As directed    Call MD for:  temperature >100.4   Complete by: As directed    Diet general   Complete by: As directed    Discharge wound care:   Complete by: As directed    You may apply a light dressing for minor discharge from the incision or to keep waistbands of clothing from rubbing.  You may also have been discharge with a clear dressing in which case this will be removed at your postoperative clinic visit.  You may shower, use soap on your incision.   Avoid baths or soaking the incision in the first 6 weeks following your surgery..   Driving restriction   Complete by: As directed    Avoid driving for at least 2 weeks or while taking prescription narcotics.   Lifting restrictions   Complete by: As directed    Weight restriction of 10lbs for 6 weeks.     Allergies as of 12/01/2019      Reactions   Aloe Vera [aloe] Rash   Penicillins Rash   As infant Has patient had a PCN reaction causing immediate rash, facial/tongue/throat swelling, SOB or lightheadedness with hypotension: Unknown Has patient had a PCN reaction causing severe rash involving mucus membranes or skin necrosis: Unknown Has patient had a PCN reaction that required hospitalization: No Has patient had a PCN reaction occurring within the last 10 years: No If all of the above answers are "NO", then may proceed with Cephalosporin use.      Medication List    TAKE these medications   COLLAGEN PO Take 1 capsule by mouth at bedtime.   escitalopram 20 MG tablet Commonly known as: LEXAPRO Take 1 tablet (20 mg total) by mouth daily. What changed: when to take this   estradiol 0.1 mg/24hr patch Commonly known as: CLIMARA - Dosed in mg/24 hr Place 1 patch (0.1 mg total) onto the skin once a week.   ibuprofen 600 MG tablet Commonly known as: ADVIL Take 1 tablet (600 mg total) by mouth every 6 (six) hours as needed for fever or headache.   oxyCODONE-acetaminophen 5-325 MG tablet Commonly known as: Percocet Take 1 tablet by mouth every 4 (four) hours as needed for severe pain.            Discharge Care Instructions  (From admission, onward)         Start     Ordered   12/01/19 0000  Discharge wound care:    Comments: You may apply a light dressing for minor discharge from the incision or to keep waistbands of clothing from rubbing.  You may also have been discharge with a clear dressing in which case this will be removed at your postoperative clinic visit.  You may  shower, use soap on your incision.  Avoid baths or soaking the incision in the first 6 weeks following your surgery.Marland Kitchen   12/01/19 5615           Signed: Malachy Mood 12/01/2019, 9:22 AM

## 2019-12-01 NOTE — Progress Notes (Signed)
Discharge order received from doctor. Reviewed discharge instructions and prescriptions with patient and answered all questions. Follow up appointment  given. Patient verbalized understanding. Patient discharged home via wheelchair by nursing/auxillary.    Marily Konczal, RN  

## 2019-12-02 LAB — SURGICAL PATHOLOGY

## 2019-12-10 ENCOUNTER — Ambulatory Visit (INDEPENDENT_AMBULATORY_CARE_PROVIDER_SITE_OTHER): Payer: BC Managed Care – PPO | Admitting: Obstetrics and Gynecology

## 2019-12-10 ENCOUNTER — Other Ambulatory Visit: Payer: Self-pay

## 2019-12-10 ENCOUNTER — Other Ambulatory Visit: Payer: Self-pay | Admitting: Obstetrics and Gynecology

## 2019-12-10 ENCOUNTER — Encounter: Payer: Self-pay | Admitting: Obstetrics and Gynecology

## 2019-12-10 VITALS — BP 110/66 | Wt 204.0 lb

## 2019-12-10 DIAGNOSIS — Z4889 Encounter for other specified surgical aftercare: Secondary | ICD-10-CM

## 2019-12-10 MED ORDER — ESTROGENS CONJUGATED 1.25 MG PO TABS: 1.2500 mg | ORAL_TABLET | Freq: Every day | ORAL | 11 refills | Status: DC

## 2019-12-10 NOTE — Telephone Encounter (Signed)
Pt had postop today. Please advise

## 2019-12-10 NOTE — Progress Notes (Signed)
Postoperative Follow-up Patient presents post op from [place], BSO, cystoscopy 1weeks ago for Lynch Syndrome MSH-2.  Subjective: Patient reports marked improvement in her preop symptoms. Eating a regular diet without difficulty. The patient is not having any pain.  Activity: normal activities of daily living.  Objective: Blood pressure 110/66, weight 204 lb (92.5 kg), last menstrual period 11/30/2019, not currently breastfeeding.  General: NAD Pulmonary: no increased work of breathing Abdomen: soft, non-tender, non-distended, incision(s) D/C/I Extremities: no edema Neurologic: normal gait    Admission on 11/30/2019, Discharged on 12/01/2019  Component Date Value Ref Range Status  . Preg Test, Ur 11/30/2019 NEGATIVE  NEGATIVE Final   Comment:        THE SENSITIVITY OF THIS METHODOLOGY IS >24 mIU/mL   . SURGICAL PATHOLOGY 11/30/2019    Final-Edited                   Value:SURGICAL PATHOLOGY CASE: [id number] PATIENT: [facility] Surgical Pathology Report     Specimen Submitted: A. Uterus, cervix, bilateral fall tubes, ovaries  Clinical History: Lynch syndrome Z15.09, family history of ovarian cancer Z80.41.      DIAGNOSIS: A.  UTERUS WITH CERVIX, BILATERAL FALLOPIAN TUBES AND OVARIES; TOTAL HYSTERECTOMY WITH BILATERAL SALPINGO-OOPHORECTOMY: - CHRONIC CERVICITIS WITH SQUAMOUS METAPLASIA. - WEAKLY PROLIFERATIVE ENDOMETRIUM WITH CILIATED METAPLASIA. - MYOMETRIUM WITH FOCAL ADENOMYOSIS. - BILATERAL OVARIES WITH CYSTIC FOLLICLES. - UNREMARKABLE BILATERAL FALLOPIAN TUBES WITH FIMBRIATED END. - NEGATIVE FOR ATYPIA AND MALIGNANCY.  Comment: Given the patient's history of Lynch syndrome the entire lower uterine segment, endometrium, bilateral fallopian tubes, and bilateral ovaries were entirely submitted for microscopic examination.  GROSS DESCRIPTION: A. Labeled: Uterus, cervix, fallopian tubes ovaries bilateral Receiv                         ed: In  formalin Weight: 62 grams Dimensions:      Fundus -5 x 5.3 x 2.5 cm      Cervix -3.6 x 3.8 x 3 cm Serosa: Pink-red and relatively smooth Cervix: The ectocervical mucosa is pink-purple and somewhat congested Endocervix: The squamocolumnar junction is not distinct. Endometrial cavity:      Dimensions -3.4 x 2.6 x 0.3 cm      Thickness -0.1 cm      Other findings -unremarkable Myometrium:     Thickness -2.5 cm     Other findings -serial sectioning through the myometrium does not reveal any nodularities or masses Adnexa:      Right ovary           Weight -with tube 7 grams           Measurement -2.9 x 2.7 x 0.8 cm           Serosa -pale-tan and cerebriform           Cut surface -the cut surface reveals several cystic spaces from 0.1 to 0.3 cm in diameter, containing clear fluid.  The rest of the cut surface is vaguely nodular.      Right fallopian tube           Measurements -6.5 cm in length x 0.4-0.6 cm in diameter           Other findings -an unremarkabl                         e fallopian tube with fimbria      Left ovary  Weight -with tube 5 grams           Measurement -2.8 x 2.5 x 0.4 cm           Serosa -pale-tan and cerebriform           Cut surface -the cut surface reveals several cystic spaces from 0.4 to 0.5 cm in diameter, containing clear fluid.  The rest of the cut surface is vaguely nodular      Left fallopian tube            Measurements -5.2 cm in length x 0.4-0.5 cm in diameter           Other findings -unremarkable fallopian tube with fimbria Other comments: Fallopian tubes and ovaries are entirely submitted as per SEE- FIM protocol  Block summary: 1-2-cervix 3-6-endomyometrium 7-10-entire serially sectioned ovary 11-13-entire longitudinally sectioned fimbriated end of right fallopian tube and rest submitted in cross-section 14-16-entire serially sectioned left ovary 17-18-entire longitudinally sectioned fimbriated end of left fallopian tube  and rest submitted in cross-section 19-22 - entire lower uterine segme                         nt 23-28 - entire endometrium   Final Diagnosis performed by Quay Burow, MD.   Electronically signed 12/02/2019 11:07:48AM The electronic signature indicates that the named Attending Pathologist has evaluated the specimen Technical component performed at [facility], [address] [place], [place] [address] Lab: [phone] Dir: Rush Farmer, MD, MMM  Professional component performed at [facility], [facility], [place], [place], [place] [address] Lab: [phone] Dir: Dellia Nims. Rubinas, MD   . WBC 12/01/2019 15.5* 4.0 - 10.5 K/uL Final  . RBC 12/01/2019 4.40  3.87 - 5.11 MIL/uL Final  . Hemoglobin 12/01/2019 12.3  12.0 - 15.0 g/dL Final  . HCT 12/01/2019 37.0  36 - 46 % Final  . MCV 12/01/2019 84.1  80.0 - 100.0 fL Final  . MCH 12/01/2019 28.0  26.0 - 34.0 pg Final  . MCHC 12/01/2019 33.2  30.0 - 36.0 g/dL Final  . RDW 12/01/2019 12.9  11.5 - 15.5 % Final  . Platelets 12/01/2019 230  150 - 400 K/uL Final  . nRBC 12/01/2019 0.0  0.0 - 0.2 % Final   Performed at [facility], [address] [place], [place] [address]  . Sodium 12/01/2019 141  135 - 145 mmol/L Final  . Potassium 12/01/2019 4.4  3.5 - 5.1 mmol/L Final  . Chloride 12/01/2019 110  98 - 111 mmol/L Final  . CO2 12/01/2019 25  22 - 32 mmol/L Final  . Glucose, Bld 12/01/2019 126* 70 - 99 mg/dL Final   Glucose reference range applies only to samples taken after fasting for at least 8 hours.  . BUN 12/01/2019 9  6 - 20 mg/dL Final  . Creatinine, Ser 12/01/2019 0.73  0.44 - 1.00 mg/dL Final  . Calcium 12/01/2019 8.6* 8.9 - 10.3 mg/dL Final  . GFR calc non Af Amer 12/01/2019 >60  >60 mL/min Final  . GFR calc Af Amer 12/01/2019 >60  >60 mL/min Final  . Anion gap 12/01/2019 6  5 - 15 Final   Performed at [facility], [address], [place], [place] [address]    Assessment: 34 y.o. s/p  TLH, BSO, cystoscopy stable  Plan: Patient has done well after surgery with no apparent complications.  I have discussed the post-operative course to date, and the expected progress moving  forward.  The patient understands what complications to be concerned about.  I will see the patient in routine follow up, or sooner if needed.    Activity plan: No heavy lifting, no intercourse  Has done well from vasomotor standpoint but does not like patches.  Will switch to po premarin   Malachy Mood, MD, [place], [facility] 12/10/2019, 9:27 AM

## 2019-12-14 ENCOUNTER — Other Ambulatory Visit: Payer: Self-pay | Admitting: Obstetrics and Gynecology

## 2019-12-14 NOTE — Telephone Encounter (Signed)
We can do a prior authorization and if that get denied see if we can do estrace instead

## 2019-12-21 ENCOUNTER — Other Ambulatory Visit: Payer: Self-pay | Admitting: Obstetrics and Gynecology

## 2019-12-21 MED ORDER — ESTRADIOL 2 MG PO TABS: 2.0000 mg | ORAL_TABLET | Freq: Every day | ORAL | 11 refills | Status: DC

## 2020-01-11 ENCOUNTER — Other Ambulatory Visit: Payer: Self-pay

## 2020-01-11 ENCOUNTER — Ambulatory Visit (INDEPENDENT_AMBULATORY_CARE_PROVIDER_SITE_OTHER): Payer: BC Managed Care – PPO | Admitting: Obstetrics and Gynecology

## 2020-01-11 ENCOUNTER — Encounter: Payer: Self-pay | Admitting: Obstetrics and Gynecology

## 2020-01-11 VITALS — BP 106/72 | HR 69 | Wt 208.0 lb

## 2020-01-11 DIAGNOSIS — Z4889 Encounter for other specified surgical aftercare: Secondary | ICD-10-CM

## 2020-01-11 NOTE — Progress Notes (Signed)
Postoperative Follow-up Patient presents post op from TLH, BSO, cystoscopy 6weeks ago for Lynch Syndrome.  Subjective: Patient reports marked improvement in her preop symptoms. Eating a regular diet without difficulty. The patient is not having any pain.  Activity: normal activities of daily living. No vaginal bleeding.  Objective: Blood pressure 106/72, pulse 69, weight 208 lb (94.3 kg), not currently breastfeeding.  General: NAD Pulmonary: no increased work of breathing Abdomen: soft, non-tender, non-distended, incision(s) D/C/I GU: normal external female genitalia vaginal cuff intact, well healed, portion of V-loc still visible right aspect of cuff Extremities: no edema Neurologic: normal gait    Admission on 11/30/2019, Discharged on 12/01/2019  Component Date Value Ref Range Status  . Preg Test, Ur 11/30/2019 NEGATIVE  NEGATIVE Final   Comment:        THE SENSITIVITY OF THIS METHODOLOGY IS >24 mIU/mL   . SURGICAL PATHOLOGY 11/30/2019    Final-Edited                   Value:SURGICAL PATHOLOGY CASE: [id number] PATIENT: [facility] Surgical Pathology Report     Specimen Submitted: A. Uterus, cervix, bilateral fall tubes, ovaries  Clinical History: Lynch syndrome Z15.09, family history of ovarian cancer Z80.41.      DIAGNOSIS: A.  UTERUS WITH CERVIX, BILATERAL FALLOPIAN TUBES AND OVARIES; TOTAL HYSTERECTOMY WITH BILATERAL SALPINGO-OOPHORECTOMY: - CHRONIC CERVICITIS WITH SQUAMOUS METAPLASIA. - WEAKLY PROLIFERATIVE ENDOMETRIUM WITH CILIATED METAPLASIA. - MYOMETRIUM WITH FOCAL ADENOMYOSIS. - BILATERAL OVARIES WITH CYSTIC FOLLICLES. - UNREMARKABLE BILATERAL FALLOPIAN TUBES WITH FIMBRIATED END. - NEGATIVE FOR ATYPIA AND MALIGNANCY.  Comment: Given the patient's history of Lynch syndrome the entire lower uterine segment, endometrium, bilateral fallopian tubes, and bilateral ovaries were entirely submitted for microscopic examination.  GROSS  DESCRIPTION: A. Labeled: Uterus, cervix, fallopian tubes ovaries bilateral Receiv                         ed: In formalin Weight: 62 grams Dimensions:      Fundus -5 x 5.3 x 2.5 cm      Cervix -3.6 x 3.8 x 3 cm Serosa: Pink-red and relatively smooth Cervix: The ectocervical mucosa is pink-purple and somewhat congested Endocervix: The squamocolumnar junction is not distinct. Endometrial cavity:      Dimensions -3.4 x 2.6 x 0.3 cm      Thickness -0.1 cm      Other findings -unremarkable Myometrium:     Thickness -2.5 cm     Other findings -serial sectioning through the myometrium does not reveal any nodularities or masses Adnexa:      Right ovary           Weight -with tube 7 grams           Measurement -2.9 x 2.7 x 0.8 cm           Serosa -pale-tan and cerebriform           Cut surface -the cut surface reveals several cystic spaces from 0.1 to 0.3 cm in diameter, containing clear fluid.  The rest of the cut surface is vaguely nodular.      Right fallopian tube           Measurements -6.5 cm in length x 0.4-0.6 cm in diameter           Other findings -an unremarkabl  e fallopian tube with fimbria      Left ovary            Weight -with tube 5 grams           Measurement -2.8 x 2.5 x 0.4 cm           Serosa -pale-tan and cerebriform           Cut surface -the cut surface reveals several cystic spaces from 0.4 to 0.5 cm in diameter, containing clear fluid.  The rest of the cut surface is vaguely nodular      Left fallopian tube            Measurements -5.2 cm in length x 0.4-0.5 cm in diameter           Other findings -unremarkable fallopian tube with fimbria Other comments: Fallopian tubes and ovaries are entirely submitted as per SEE- FIM protocol  Block summary: 1-2-cervix 3-6-endomyometrium 7-10-entire serially sectioned ovary 11-13-entire longitudinally sectioned fimbriated end of right fallopian tube and rest submitted in  cross-section 14-16-entire serially sectioned left ovary 17-18-entire longitudinally sectioned fimbriated end of left fallopian tube and rest submitted in cross-section 19-22 - entire lower uterine segme                         nt 23-28 - entire endometrium   Final Diagnosis performed by Elijah Birk, MD.   Electronically signed 12/02/2019 11:07:48AM The electronic signature indicates that the named Attending Pathologist has evaluated the specimen Technical component performed at [facility], [address] [place], [place] [address] Lab: [phone] Dir: Jolene Schimke, MD, MMM  Professional component performed at [facility], [facility], [address] [place], [place], [place] [address] Lab: [phone] Dir: Georgiann Cocker. Rubinas, MD   . WBC 12/01/2019 15.5* 4.0 - 10.5 K/uL Final  . RBC 12/01/2019 4.40  3.87 - 5.11 MIL/uL Final  . Hemoglobin 12/01/2019 12.3  12.0 - 15.0 g/dL Final  . HCT 54/27/0623 37.0  36 - 46 % Final  . MCV 12/01/2019 84.1  80.0 - 100.0 fL Final  . MCH 12/01/2019 28.0  26.0 - 34.0 pg Final  . MCHC 12/01/2019 33.2  30.0 - 36.0 g/dL Final  . RDW 76/28/3151 12.9  11.5 - 15.5 % Final  . Platelets 12/01/2019 230  150 - 400 K/uL Final  . nRBC 12/01/2019 0.0  0.0 - 0.2 % Final   Performed at [facility], [address], [place], [place] [address]  . Sodium 12/01/2019 141  135 - 145 mmol/L Final  . Potassium 12/01/2019 4.4  3.5 - 5.1 mmol/L Final  . Chloride 12/01/2019 110  98 - 111 mmol/L Final  . CO2 12/01/2019 25  22 - 32 mmol/L Final  . Glucose, Bld 12/01/2019 126* 70 - 99 mg/dL Final   Glucose reference range applies only to samples taken after fasting for at least 8 hours.  . BUN 12/01/2019 9  6 - 20 mg/dL Final  . Creatinine, Ser 12/01/2019 0.73  0.44 - 1.00 mg/dL Final  . Calcium 73/71/0626 8.6* 8.9 - 10.3 mg/dL Final  . GFR calc non Af Amer 12/01/2019 >60  >60 mL/min Final  . GFR calc Af Amer 12/01/2019 >60  >60 mL/min Final  . Anion gap  12/01/2019 6  5 - 15 Final   Performed at [facility], [address] [place], [place] [address]    Assessment: 34 y.o. s/p TLH, BSO, cystoscopy stable  Plan: Patient  has done well after surgery with no apparent complications.  I have discussed the post-operative course to date, and the expected progress moving forward.  The patient understands what complications to be concerned about.  I will see the patient in routine follow up, or sooner if needed.    Activity plan: No restriction.   Vena Austria, MD, [facility], [facility] 01/11/2020, 9:13 AM

## 2020-01-13 ENCOUNTER — Other Ambulatory Visit: Payer: Self-pay | Admitting: Obstetrics and Gynecology

## 2020-01-13 NOTE — Telephone Encounter (Signed)
Please advise 

## 2020-04-20 ENCOUNTER — Other Ambulatory Visit: Payer: Self-pay | Admitting: Obstetrics and Gynecology

## 2020-04-20 MED ORDER — SERTRALINE HCL 50 MG PO TABS: 50.0000 mg | ORAL_TABLET | Freq: Every day | ORAL | 2 refills | Status: DC

## 2020-04-20 NOTE — Telephone Encounter (Signed)
2-4 weeks medication follow up phone

## 2020-04-20 NOTE — Progress Notes (Signed)
PHQ-9 of 8 and GAD-7 of 8 feels lexapro 20mg  not covering symptoms as well.  Is interested in switching pharmacotherapy.  Will switch to Zoloft 50mg 

## 2020-05-04 ENCOUNTER — Other Ambulatory Visit: Payer: Self-pay

## 2020-05-04 ENCOUNTER — Ambulatory Visit (INDEPENDENT_AMBULATORY_CARE_PROVIDER_SITE_OTHER): Payer: BC Managed Care – PPO | Admitting: Obstetrics and Gynecology

## 2020-05-04 ENCOUNTER — Encounter: Payer: Self-pay | Admitting: Obstetrics and Gynecology

## 2020-05-04 DIAGNOSIS — F419 Anxiety disorder, unspecified: Secondary | ICD-10-CM

## 2020-05-04 DIAGNOSIS — F32A Depression, unspecified: Secondary | ICD-10-CM

## 2020-05-04 MED ORDER — SERTRALINE HCL 50 MG PO TABS: 50.0000 mg | ORAL_TABLET | Freq: Every day | ORAL | 3 refills | Status: DC

## 2020-05-04 NOTE — Progress Notes (Signed)
I connected with Jessica Cardenas  on 05/04/20 at 10:10 AM EST by telephone and verified that I am speaking with the correct person using two identifiers.   I discussed the limitations, risks, security and privacy concerns of performing an evaluation and management service by telephone and the availability of in person appointments. I also discussed with the patient that there may be a patient responsible charge related to this service. The patient expressed understanding and agreed to proceed.  The patient was at home I spoke with the patient from my workstation phone The names of people involved in this encounter were: Jessica Cardenas , and [facility] Visit   Chief Complaint:  Chief Complaint  Patient presents with  . Follow-up    History of Present Illness: The patient is a 34 y.o. female presenting follow up for symptoms of anxiety and depression.  The patient is currently taking Zoloft 14m for the management of her symptoms.  She has not had any recent situational stressors other than usual work related stressors.  She reports almost complete resolution of symptoms, is able to focus better.  No problems with sleep. .     The patient does have a pre-existing history of depression and anxiety.  She  does not a prior history of suicide attempts.    Review of Systems: Review of Systems  Constitutional: Negative.   Gastrointestinal: Negative for nausea.  Neurological: Negative for headaches.  Psychiatric/Behavioral: Negative.      Past Medical History:  Past Medical History:  Diagnosis Date  . 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 GERD (gastroesophageal reflux disease)   . Lynch syndrome    per [facility] chart positive for MSH2- needs annual pelvic u/s, CA 125, endometrial bx, urine cytology, colonoscopy and endoscopy  . Post partum depression   . Pre-diabetes     Past Surgical History:  Past Surgical History:  Procedure Laterality Date    . COLONOSCOPY N/A 01/2014, 2017   HX of Lynch Syndrome (genetic)  . CYSTOSCOPY N/A 11/30/2019   Procedure: CYSTOSCOPY;  Surgeon: SMalachy Mood MD;  Location: [facility] ORS;  Service: Gynecology;  Laterality: N/A;  . DILATION AND EVACUATION N/A 09/02/2017   Procedure: DILATATION AND EVACUATION;  Surgeon: SMalachy Mood MD;  Location: [facility] ORS;  Service: Gynecology;  Laterality: N/A;  . ENDOMETRIAL BIOPSY  2017   normal  . ESOPHAGOGASTRODUODENOSCOPY ENDOSCOPY    . TOTAL LAPAROSCOPIC HYSTERECTOMY WITH BILATERAL SALPINGO OOPHORECTOMY Bilateral 11/30/2019   Procedure: TOTAL LAPAROSCOPIC HYSTERECTOMY WITH BILATERAL SALPINGO OOPHORECTOMY;  Surgeon: SMalachy Mood MD;  Location: [facility] ORS;  Service: Gynecology;  Laterality: Bilateral;    Gynecologic History: No LMP recorded.  Obstetric History: [id number] Family History:  Family History  Problem Relation Age of Onset  . Cardiomyopathy Maternal Grandmother   . Breast cancer Maternal Grandmother 422 . Stomach cancer Paternal Grandmother 73 . Colon cancer Maternal Grandfather        Lynch syndrome  . Ovarian cancer Maternal Aunt 30       lynch positive  . Uterine cancer Maternal Aunt     Social History:  Social History   Socioeconomic History  . Marital status: Married    Spouse name: Not on file  . Number of children: 1  . Years of education: Not on file  . Highest education level: Not on file  Occupational History  . Occupation: Teacher-special education  Tobacco Use  . Smoking status: Never Smoker  .  Smokeless tobacco: Never Used  Vaping Use  . Vaping Use: Never used  Substance and Sexual Activity  . Alcohol use: Yes    Comment: occasional  . Drug use: Never  . Sexual activity: Yes  Other Topics Concern  . Not on file  Social History Narrative  . Not on file   Social Determinants of Health   Financial Resource Strain:   . Difficulty of Paying Living Expenses: Not on file  Food Insecurity:   . Worried About  Charity fundraiser in the Last Year: Not on file  . Ran Out of Food in the Last Year: Not on file  Transportation Needs:   . Lack of Transportation (Medical): Not on file  . Lack of Transportation (Non-Medical): Not on file  Physical Activity:   . Days of Exercise per Week: Not on file  . Minutes of Exercise per Session: Not on file  Stress:   . Feeling of Stress : Not on file  Social Connections:   . Frequency of Communication with Friends and Family: Not on file  . Frequency of Social Gatherings with Friends and Family: Not on file  . Attends Religious Services: Not on file  . Active Member of Clubs or Organizations: Not on file  . Attends Archivist Meetings: Not on file  . Marital Status: Not on file  Intimate Partner Violence:   . Fear of Current or Ex-Partner: Not on file  . Emotionally Abused: Not on file  . Physically Abused: Not on file  . Sexually Abused: Not on file    Allergies:  Allergies  Allergen Reactions  . Aloe Vera [Aloe] Rash  . Penicillins Rash    As infant Has patient had a PCN reaction causing immediate rash, facial/tongue/throat swelling, SOB or lightheadedness with hypotension: Unknown Has patient had a PCN reaction causing severe rash involving mucus membranes or skin necrosis: Unknown Has patient had a PCN reaction that required hospitalization: No Has patient had a PCN reaction occurring within the last 10 years: No If all of the above answers are "NO", then may proceed with Cephalosporin use.     Medications: Prior to Admission medications   Medication Sig Start Date End Date Taking? Authorizing Provider  COLLAGEN PO Take 1 capsule by mouth at bedtime.   Yes [provider]  estradiol (ESTRACE) 2 MG tablet TAKE 1 TABLET BY MOUTH EVERY DAY 01/14/20  Yes Malachy Mood, MD  sertraline (ZOLOFT) 50 MG tablet Take 1 tablet (50 mg total) by mouth daily. 04/20/20  Yes Malachy Mood, MD    Physical Exam Vitals: There were  no vitals filed for this visit. No LMP recorded.  No physical exam as this was a remote telephone visit to promote social distancing during the current COVID-19 Pandemic   GAD 7 : Generalized Anxiety Score 05/04/2020 09/20/2019  Nervous, Anxious, on Edge 1 2  Control/stop worrying 0 1  Worry too much - different things 1 1  Trouble relaxing 1 1  Restless 0 0  Easily annoyed or irritable 1 1  Afraid - awful might happen 0 0  Total GAD 7 Score 4 6  Anxiety Difficulty Not difficult at all Not difficult at all    Depression screen [facility] 2/9 05/04/2020 09/20/2019  Decreased Interest 1 0  Down, Depressed, Hopeless 0 1  PHQ - 2 Score 1 1  Altered sleeping 0 1  Tired, decreased energy 1 1  Change in appetite 1 0  Feeling bad or failure  about yourself  0 1  Trouble concentrating 1 1  Moving slowly or fidgety/restless 0 1  Suicidal thoughts 0 0  PHQ-9 Score 4 6  Difficult doing work/chores Not difficult at all Not difficult at all    Depression screen [facility] 2/9 05/04/2020 09/20/2019  Decreased Interest 1 0  Down, Depressed, Hopeless 0 1  PHQ - 2 Score 1 1  Altered sleeping 0 1  Tired, decreased energy 1 1  Change in appetite 1 0  Feeling bad or failure about yourself  0 1  Trouble concentrating 1 1  Moving slowly or fidgety/restless 0 1  Suicidal thoughts 0 0  PHQ-9 Score 4 6  Difficult doing work/chores Not difficult at all Not difficult at all     Assessment: 34 y.o. [id number] follow up anxiety depression  Plan: Problem List Items Addressed This Visit    None    Visit Diagnoses    Anxiety and depression    -  Primary   Relevant Medications   sertraline (ZOLOFT) 50 MG tablet      1) Anxiety and depression - excellent response to Zoloft 36m without side-effects - continue at present dose - follow up 2-3 months  2) Thyroid and B12 screen has not been obtained previously  3) Telephone time 8:058m  Return in about 3 months (around 08/04/2020) for medication follow up  phone 2-3 months.    AnMalachy MoodMD, F[facility]ummitvilleB/GYN, [facility] 05/04/2020, 10:38 AM

## 2020-08-03 ENCOUNTER — Ambulatory Visit: Payer: BC Managed Care – PPO | Admitting: Obstetrics and Gynecology

## 2021-01-01 ENCOUNTER — Ambulatory Visit (INDEPENDENT_AMBULATORY_CARE_PROVIDER_SITE_OTHER): Payer: BC Managed Care – PPO | Admitting: Obstetrics and Gynecology

## 2021-01-01 ENCOUNTER — Encounter: Payer: Self-pay | Admitting: Obstetrics and Gynecology

## 2021-01-01 ENCOUNTER — Other Ambulatory Visit: Payer: Self-pay

## 2021-01-01 VITALS — BP 112/68 | HR 68 | Ht 64.0 in | Wt 204.0 lb

## 2021-01-01 DIAGNOSIS — Z1239 Encounter for other screening for malignant neoplasm of breast: Secondary | ICD-10-CM | POA: Diagnosis not present

## 2021-01-01 DIAGNOSIS — Z01419 Encounter for gynecological examination (general) (routine) without abnormal findings: Secondary | ICD-10-CM | POA: Diagnosis not present

## 2021-01-01 MED ORDER — ESTRADIOL 2 MG PO TABS: 2.0000 mg | ORAL_TABLET | Freq: Every day | ORAL | 4 refills | Status: AC

## 2021-01-01 MED ORDER — SERTRALINE HCL 50 MG PO TABS: 50.0000 mg | ORAL_TABLET | Freq: Every day | ORAL | 3 refills | Status: AC

## 2021-01-01 NOTE — Progress Notes (Signed)
Gynecology Annual Exam   PCP: [facility]  Chief Complaint:  Chief Complaint  Patient presents with   Gynecologic Exam    Annual - no concerns. RM 5    History of Present Illness: Patient is a 35 y.o. Y4M2500 presents for annual exam. The patient has no complaints today.   LMP: Patient's last menstrual period was 11/30/2019.  The patient is sexually active. She currently uses status post hysterectomy for contraception. She denies dyspareunia.  The patient does perform self breast exams.  There is notable family history of breast or ovarian cancer in her family.  Known lynch syndrome carrier.  The patient wears seatbelts: yes.   The patient has regular exercise: not asked.    She is doing well with current dose of estrace as far as vasomotor symptoms.   The patient denies current symptoms of depression.  Doing well on current dose Zoloft.  Review of Systems: Review of Systems  Constitutional:  Negative for chills and fever.  HENT:  Negative for congestion.   Respiratory:  Negative for cough and shortness of breath.   Cardiovascular:  Negative for chest pain and palpitations.  Gastrointestinal:  Negative for abdominal pain, constipation, diarrhea, heartburn, nausea and vomiting.  Genitourinary:  Negative for dysuria, frequency and urgency.  Skin:  Negative for itching and rash.  Neurological:  Negative for dizziness and headaches.  Endo/Heme/Allergies:  Negative for polydipsia.  Psychiatric/Behavioral:  Negative for depression.    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 Specifically MSH2 mutation (2634G>A) individuals with a MSH2 mutation have up to an 80% lifetime colon cancer risk (though this risk may be closer to 50% in women). In addition, there are increased risks for stomach (5%), small bowel (3% in women, 6% in men), urinary tract (12% in women, 28% in men), and brain (3%) cancers. Women with a MSH2 mutation have a 40-60% lifetime risk of uterine cancer and a 9-12% risk of ovarian cancer. She is aware that a complete hysterectomy will reduce but not eliminate the risk for uterine and ovarian cancer.         History of cervical dysplasia 05/27/2017    CIN 1 in 2012      Past Surgical History:  Past Surgical History:  Procedure Laterality Date   COLONOSCOPY N/A 01/2014, 2017   HX of Lynch Syndrome (genetic)   CYSTOSCOPY N/A 11/30/2019   Procedure: CYSTOSCOPY;  Surgeon: Malachy Mood, MD;  Location: [facility] ORS;  Service: Gynecology;  Laterality: N/A;   DILATION AND EVACUATION N/A 09/02/2017   Procedure: DILATATION AND EVACUATION;  Surgeon: Malachy Mood, MD;  Location: [facility] ORS;  Service: Gynecology;  Laterality: N/A;   ENDOMETRIAL BIOPSY  2017   normal   ESOPHAGOGASTRODUODENOSCOPY ENDOSCOPY     TOTAL LAPAROSCOPIC HYSTERECTOMY WITH BILATERAL SALPINGO OOPHORECTOMY Bilateral 11/30/2019   Procedure: TOTAL LAPAROSCOPIC HYSTERECTOMY WITH BILATERAL SALPINGO OOPHORECTOMY;  Surgeon: Malachy Mood, MD;  Location: [facility] ORS;  Service: Gynecology;  Laterality: Bilateral;    Gynecologic History:  Patient's last menstrual period was 11/30/2019. Contraception: status  post hysterectomy  Obstetric History: [id number]  Family History:  Family History  Problem Relation Age of Onset   Kidney cancer Mother    Ovarian cancer Maternal Aunt 76       lynch positive   Uterine cancer Maternal Aunt    Pancreatic cancer Maternal Uncle    Cardiomyopathy Maternal Grandmother    Breast  cancer Maternal Grandmother 77   Colon cancer Maternal Grandfather        Lynch syndrome   Stomach cancer Paternal Grandmother 75    Social History:  Social History   Socioeconomic History   Marital status: Married    Spouse name: Not on file   Number of children: 1   Years of education: Not on file   Highest education level: Not on file  Occupational History   Occupation: Teacher-special education  Tobacco Use   Smoking status: Never   Smokeless tobacco: Never  Vaping Use   Vaping Use: Never used  Substance and Sexual Activity   Alcohol use: Yes    Comment: occasional   Drug use: Never   Sexual activity: Yes    Birth control/protection: Surgical    Comment: Hyst  Other Topics Concern   Not on file  Social History Narrative   Not on file   Social Determinants of Health   Financial Resource Strain: Not on file  Food Insecurity: Not on file  Transportation Needs: Not on file  Physical Activity: Not on file  Stress: Not on file  Social Connections: Not on file  Intimate Partner Violence: Not on file    Allergies:  Allergies  Allergen Reactions   Aloe Vera [Aloe] Rash   Penicillins Rash    As infant Has patient had a PCN reaction causing immediate rash, facial/tongue/throat swelling, SOB or lightheadedness with hypotension: Unknown Has patient had a PCN reaction causing severe rash involving mucus membranes or skin necrosis: Unknown Has patient had a PCN reaction that required hospitalization: No Has patient had a PCN reaction occurring within the last 10 years: No If all of the above answers are "NO", then may proceed with Cephalosporin use.     Medications: Prior to Admission medications   Medication Sig Start Date End Date Taking? Authorizing Provider  estradiol (ESTRACE) 2 MG tablet TAKE 1 TABLET BY MOUTH EVERY DAY 01/14/20  Yes Malachy Mood, MD  sertraline (ZOLOFT) 50 MG tablet Take 1 tablet (50 mg total) by mouth daily. 05/04/20  Yes Malachy Mood, MD  COLLAGEN PO Take 1 capsule by mouth at bedtime. Patient not taking: Reported on 01/01/2021    [provider]    Physical Exam Vitals: Blood pressure 112/68, pulse 68, height '5\' 4"'  (1.626 m), weight 204 lb (92.5 kg), last menstrual period 11/30/2019.  General: NAD HEENT: normocephalic, anicteric Thyroid: no enlargement, no palpable nodules Pulmonary: No increased work of breathing, CTAB Cardiovascular: RRR, distal pulses 2+ Breast: Breast symmetrical, no tenderness, no palpable nodules or masses, no skin or nipple retraction present, no nipple discharge.  No axillary or supraclavicular lymphadenopathy. Abdomen: NABS, soft, non-tender, non-distended.  Umbilicus without lesions.  No hepatomegaly, splenomegaly or masses palpable. No evidence of hernia  Genitourinary:  External: Normal external female genitalia.  Normal urethral meatus, normal Bartholin's and Skene's glands.    Vagina: Normal vaginal mucosa, no evidence of prolapse.    Cervix: surgically absent  Uterus: surgically absent  Adnexa: ovaries non-enlarged, no adnexal masses  Rectal: deferred  Lymphatic: no evidence of inguinal lymphadenopathy Extremities: no edema, erythema,  or tenderness Neurologic: Grossly intact Psychiatric: mood appropriate, affect full  Female chaperone present for pelvic and breast  portions of the physical exam    Assessment: 35 y.o. [id number] routine annual exam  Plan: Problem List Items Addressed This Visit   None Visit Diagnoses     Encounter for gynecological examination without abnormal finding    -  Primary   Breast screening           1) STI screening  was notoffered and therefore not obtained  2)  ASCCP guidelines and rational discussed.  Patient opts for discontinue secondary to prior hysterectomy screening interval  3) Contraception - the patient is currently using  status post hysterectomy.  She is not currently in need of contraception secondary to being  sterile  4) Routine healthcare maintenance including cholesterol, diabetes screening discussed managed by PCP  5) Surgical menopause - refill estrace 73m po  6) Anxiety/Depression - continue Zoloft 581mpo daily  7) Return in about 1 year (around 01/01/2022) for annual.   AnMalachy MoodMD, [facility] 01/01/2021, 8:37 AM

## 2021-04-10 ENCOUNTER — Other Ambulatory Visit: Payer: Self-pay | Admitting: Obstetrics and Gynecology

## 2021-04-10 MED ORDER — FLUCONAZOLE 150 MG PO TABS
150.0000 mg | ORAL_TABLET | Freq: Once | ORAL | 0 refills | Status: AC
Start: 2021-04-10 — End: 2021-04-10
